# Patient Record
Sex: Female | Born: 1960 | Race: Black or African American | Hispanic: No | Marital: Single | Smoking: Never smoker
Health system: Southern US, Community
[De-identification: ages and names within clinical notes are randomized; demographics above are authoritative.]

## PROBLEM LIST (undated history)

## (undated) DIAGNOSIS — C801 Malignant (primary) neoplasm, unspecified: Secondary | ICD-10-CM

## (undated) DIAGNOSIS — R519 Headache, unspecified: Secondary | ICD-10-CM

## (undated) HISTORY — PX: BREAST EXCISIONAL BIOPSY: SUR124

---

## 1991-09-25 DIAGNOSIS — R011 Cardiac murmur, unspecified: Secondary | ICD-10-CM | POA: Insufficient documentation

## 1991-09-25 HISTORY — DX: Cardiac murmur, unspecified: R01.1

## 1999-09-25 DIAGNOSIS — N809 Endometriosis, unspecified: Secondary | ICD-10-CM

## 1999-09-25 HISTORY — PX: OTHER SURGICAL HISTORY: SHX169

## 1999-09-25 HISTORY — DX: Endometriosis, unspecified: N80.9

## 2003-09-25 HISTORY — PX: OTHER SURGICAL HISTORY: SHX169

## 2006-09-24 DIAGNOSIS — Q438 Other specified congenital malformations of intestine: Secondary | ICD-10-CM | POA: Insufficient documentation

## 2006-09-24 HISTORY — PX: ABLATION ON ENDOMETRIOSIS: SHX5787

## 2006-09-24 HISTORY — PX: COLON SURGERY: SHX602

## 2006-09-24 HISTORY — DX: Other specified congenital malformations of intestine: Q43.8

## 2020-05-24 ENCOUNTER — Ambulatory Visit: Payer: Self-pay | Admitting: Internal Medicine

## 2020-05-24 ENCOUNTER — Encounter: Payer: Self-pay | Admitting: Internal Medicine

## 2020-05-24 VITALS — BP 130/84 | HR 70 | Resp 12 | Ht 69.5 in | Wt 168.0 lb

## 2020-05-24 DIAGNOSIS — R011 Cardiac murmur, unspecified: Secondary | ICD-10-CM

## 2020-05-24 DIAGNOSIS — N809 Endometriosis, unspecified: Secondary | ICD-10-CM | POA: Insufficient documentation

## 2020-05-24 DIAGNOSIS — Z13220 Encounter for screening for lipoid disorders: Secondary | ICD-10-CM

## 2020-05-24 DIAGNOSIS — N951 Menopausal and female climacteric states: Secondary | ICD-10-CM

## 2020-05-24 DIAGNOSIS — R079 Chest pain, unspecified: Secondary | ICD-10-CM

## 2020-05-24 DIAGNOSIS — R1031 Right lower quadrant pain: Secondary | ICD-10-CM

## 2020-05-24 NOTE — Patient Instructions (Signed)
[facility] ?[address]  ?[place], [place] [address] ?[phone] ?40% Discount for [facility] Patients  ?

## 2020-05-24 NOTE — Progress Notes (Signed)
Subjective:    Patient ID: Valerie Webb Height, female   DOB: 1961-08-30, 59 y.o.   MRN: [id number]   HPI   Here to establish  1.  Menopausal symptoms:  Started age 9 yo.  Having hot flashes and night sweats, restless sleep.  Has tried different vitamins.  Not really bothering her that much.    2.  Right lower abdominal pain for about 2 months.  Feels like cramping--more like period cramping.  Comes on without obvious provocation--no link to eating.  No change with BM.  Can last anywhere from 5 minutes to 30 minutes and resolves generally by itself.  Takes Alka seltzer as that helped with cramps in the past.  No associated nausea, vomiting, diarrhea, constipation.  No vaginal discharge or associated vaginal bleeding.  No weight loss.   She still has her appendix and gallbladder intact.   3.  Left sternal chest pain:   She is concerned as she has developed left sternal sharp chest pain.  More prominent if sleeping on left side.  If she rolls over on her right side, resolves quickly.   Can happen when at rest or with exertion.  This has not changed from the beginning.   4.  History of heart murmur:  Diagnosed in her 42s.  She underwent evaluation--sounds like she had and echo.  She felt she was having pain with it and was given an unknown medication.  Was only to take if felt pain.  She cannot recall what the medication was.  No outpatient medications have been marked as taking for the 05/24/20 encounter (Office Visit) with Mack Hook, MD.   No Known Allergies   Past Medical History:  Diagnosis Date  . Congenital redundant colon 2008  . Endometriosis 2001   Found on exploratory laparoscopic surgery.  Marland Kitchen Heart murmur 1993    Past Surgical History:  Procedure Laterality Date  . ABLATION ON ENDOMETRIOSIS  2008  . COLON SURGERY  2008   Removal of redundant colon found on surgery for removal of endometriosis surgery  . Exploratory Laparoscopic surgery  2001   Pelvic and  abdominal pain    Family History  Problem Relation Age of Onset  . Alcohol abuse Father   . Cancer Father        Throat cancer:  cause of death  . Drug abuse Father   . Non-Hodgkin's lymphoma Sister 29       Remission   Social History   Socioeconomic History  . Marital status: Single    Spouse name: Not on file  . Number of children: 0  . Years of education: Not on file  . Highest education level: Bachelor's degree (e.g., BA, AB, BS)  Occupational History  . Occupation: Corporate treasurer    Comment: unemployed.  Tobacco Use  . Smoking status: Never Smoker  . Smokeless tobacco: Never Used  Vaping Use  . Vaping Use: Never used  Substance and Sexual Activity  . Alcohol use: Not Currently  . Drug use: Never  . Sexual activity: Not Currently  Other Topics Concern  . Not on file  Social History Narrative   Lives at home with her mom and youngest sister.  Other sister is living in [place].   Social Determinants of Health   Financial Resource Strain: Low Risk   . Difficulty of Paying Living Expenses: Not hard at all  Food Insecurity: No Food Insecurity  . Worried About Charity fundraiser in the Last Year:  Never true  . Ran Out of Food in the Last Year: Never true  Transportation Needs: No Transportation Needs  . Lack of Transportation (Medical): No  . Lack of Transportation (Non-Medical): No  Physical Activity:   . Days of Exercise per Week: Not on file  . Minutes of Exercise per Session: Not on file  Stress: No Stress Concern Present  . Feeling of Stress : Only a little  Social Connections: Socially Isolated  . Frequency of Communication with Friends and Family: Once a week  . Frequency of Social Gatherings with Friends and Family: Once a week  . Attends Religious Services: 1 to 4 times per year  . Active Member of Clubs or Organizations: No  . Attends Archivist Meetings: Never  . Marital Status: Never married  Intimate Partner Violence: Not At Risk  . Fear  of Current or Ex-Partner: No  . Emotionally Abused: No  . Physically Abused: No  . Sexually Abused: No      Review of Systems    Objective:   BP 130/84 (BP Location: Left Arm, Patient Position: Sitting, Cuff Size: Normal)   Pulse 70   Resp 12   Ht 5' 9.5" (1.765 m)   Wt 168 lb (76.2 kg)   LMP  (LMP Unknown)   BMI 24.45 kg/m   Physical Exam  NAD HEENT:  PERRL, EOMI, TMs pearly gray, throat without injection Neck:  Supple, No adenopathy, no thyromegaly Chest:  CTA NT with palpation over left anterior and lateral chest. CV:  RRR with occasional extrasystoles.  Grade II-VI low systolic murmur heard best at LLSB.  Perhaps a midsystolic click.  No radiation to carotids.  No carotid bruits.  Carotid, radial and DP pulses normal and equal Abd:  S, mild RLQ tenderness without rebound or peritoneal signs.  No HSM or mass, + BS LE:  No edema   ECG:  NSR, borderline LVH measurement.  No LAE   Assessment & Plan   1.  RLQ pain:  With patients previous surgeries, hard to ascertain cause.  CT of abdomen/pelvis ordered.  Discussed would need to subsequently apply for [facility] Financial assistance.   CBC, CMP  2. Heart murmur:  Not clear what has been done or results to assess in past.  Perhaps mitral in origin with findings on exam.  ECG appears normal.  Referral to cardiology.  3.  Chest pain:  Likely musculoskeletal in origin based on history.  Check FLP.    4.  Menopausal:  [facility] for black cohosh if she chooses.

## 2020-05-25 LAB — CBC WITH DIFFERENTIAL/PLATELET
Basophils Absolute: 0 10*3/uL (ref 0.0–0.2)
Basos: 0 %
EOS (ABSOLUTE): 0 10*3/uL (ref 0.0–0.4)
Eos: 0 %
Hematocrit: 40.7 % (ref 34.0–46.6)
Hemoglobin: 13.3 g/dL (ref 11.1–15.9)
Immature Grans (Abs): 0 10*3/uL (ref 0.0–0.1)
Immature Granulocytes: 0 %
Lymphocytes Absolute: 1.6 10*3/uL (ref 0.7–3.1)
Lymphs: 29 %
MCH: 26.9 pg (ref 26.6–33.0)
MCHC: 32.7 g/dL (ref 31.5–35.7)
MCV: 82 fL (ref 79–97)
Monocytes Absolute: 0.5 10*3/uL (ref 0.1–0.9)
Monocytes: 10 %
Neutrophils Absolute: 3.4 10*3/uL (ref 1.4–7.0)
Neutrophils: 61 %
Platelets: 274 10*3/uL (ref 150–450)
RBC: 4.95 x10E6/uL (ref 3.77–5.28)
RDW: 12.3 % (ref 11.7–15.4)
WBC: 5.5 10*3/uL (ref 3.4–10.8)

## 2020-05-25 LAB — COMPREHENSIVE METABOLIC PANEL
ALT: 13 IU/L (ref 0–32)
AST: 18 IU/L (ref 0–40)
Albumin/Globulin Ratio: 1.5 (ref 1.2–2.2)
Albumin: 4.4 g/dL (ref 3.8–4.9)
Alkaline Phosphatase: 118 IU/L (ref 48–121)
BUN/Creatinine Ratio: 18 (ref 9–23)
BUN: 13 mg/dL (ref 6–24)
Bilirubin Total: <0.2 mg/dL (ref 0.0–1.2)
CO2: 25 mmol/L (ref 20–29)
Calcium: 10 mg/dL (ref 8.7–10.2)
Chloride: 104 mmol/L (ref 96–106)
Creatinine, Ser: 0.71 mg/dL (ref 0.57–1.00)
GFR calc Af Amer: 109 mL/min/{1.73_m2} (ref >59)
GFR calc non Af Amer: 94 mL/min/{1.73_m2} (ref >59)
Globulin, Total: 3 g/dL (ref 1.5–4.5)
Glucose: 84 mg/dL (ref 65–99)
Potassium: 4.6 mmol/L (ref 3.5–5.2)
Sodium: 141 mmol/L (ref 134–144)
Total Protein: 7.4 g/dL (ref 6.0–8.5)

## 2020-05-25 LAB — LIPID PANEL W/O CHOL/HDL RATIO
Cholesterol, Total: 199 mg/dL (ref 100–199)
HDL: 66 mg/dL (ref >39)
LDL Chol Calc (NIH): 117 mg/dL — ABNORMAL HIGH (ref 0–99)
Triglycerides: 92 mg/dL (ref 0–149)
VLDL Cholesterol Cal: 16 mg/dL (ref 5–40)

## 2020-05-27 NOTE — Progress Notes (Signed)
Social worker met with new patient who is scheduled with Dr. Amil Amen for medical visit. Social worker completed New Patient Questionnaire which included completion of housing, intimate partner violence, transportation needs, stress, Emergency planning/management officer strain, food insecurity and screeners. Social History   Socioeconomic History  . Marital status: Single    Spouse name: Not on file  . Number of children: 0  . Years of education: Not on file  . Highest education level: Bachelor's degree (e.g., BA, AB, BS)  Occupational History  . Occupation: Corporate treasurer    Comment: unemployed.  Tobacco Use  . Smoking status: Never Smoker  . Smokeless tobacco: Never Used  Vaping Use  . Vaping Use: Never used  Substance and Sexual Activity  . Alcohol use: Not Currently  . Drug use: Never  . Sexual activity: Not Currently  Other Topics Concern  . Not on file  Social History Narrative   Lives at home with her mom and youngest sister.  Other sister is living in [place].   Social Determinants of Health   Financial Resource Strain: Low Risk   . Difficulty of Paying Living Expenses: Not hard at all  Food Insecurity: No Food Insecurity  . Worried About Charity fundraiser in the Last Year: Never true  . Ran Out of Food in the Last Year: Never true  Transportation Needs: No Transportation Needs  . Lack of Transportation (Medical): No  . Lack of Transportation (Non-Medical): No  Physical Activity:   . Days of Exercise per Week: Not on file  . Minutes of Exercise per Session: Not on file  Stress: No Stress Concern Present  . Feeling of Stress : Only a little  Social Connections: Socially Isolated  . Frequency of Communication with Friends and Family: Once a week  . Frequency of Social Gatherings with Friends and Family: Once a week  . Attends Religious Services: 1 to 4 times per year  . Active Member of Clubs or Organizations: No  . Attends Archivist Meetings: Never  . Marital Status:  Never married    Depression screen [facility] 2/9 05/27/2020  Decreased Interest 0  Down, Depressed, Hopeless 0  PHQ - 2 Score 0  Altered sleeping 1  Tired, decreased energy 1  Change in appetite 0  Feeling bad or failure about yourself  0  Trouble concentrating 0  Moving slowly or fidgety/restless 0  Suicidal thoughts 0  PHQ-9 Score 2  Difficult doing work/chores Not difficult at all    GAD 7 : Generalized Anxiety Score 05/27/2020  Nervous, Anxious, on Edge 0  Control/stop worrying 0  Worry too much - different things 0  Trouble relaxing 0  Restless 0  Easily annoyed or irritable 0  Afraid - awful might happen 0  Total GAD 7 Score 0  Anxiety Difficulty Not difficult at all     Based on presentation no recommendations at this time.   LCSW provided business card if she were to need any counseling/case management she could contact LCSW. Patient accepted card.

## 2020-06-24 ENCOUNTER — Ambulatory Visit (INDEPENDENT_AMBULATORY_CARE_PROVIDER_SITE_OTHER): Payer: Self-pay | Admitting: Internal Medicine

## 2020-06-24 ENCOUNTER — Encounter: Payer: Self-pay | Admitting: Internal Medicine

## 2020-06-24 ENCOUNTER — Other Ambulatory Visit: Payer: Self-pay

## 2020-06-24 VITALS — BP 124/88 | HR 85 | Ht 71.0 in | Wt 165.8 lb

## 2020-06-24 DIAGNOSIS — I517 Cardiomegaly: Secondary | ICD-10-CM | POA: Insufficient documentation

## 2020-06-24 DIAGNOSIS — R011 Cardiac murmur, unspecified: Secondary | ICD-10-CM

## 2020-06-24 DIAGNOSIS — E785 Hyperlipidemia, unspecified: Secondary | ICD-10-CM

## 2020-06-24 NOTE — Progress Notes (Signed)
Cardiology Office Note:    Date:  06/24/2020   ID:  Milus Height, DOB 1960/12/01, MRN [id number]  PCP:  Mack Hook, MD  [facility] Cardiologist:  Werner Lean, MD  [place] Electrophysiologist:  None   Referring MD: Mack Hook, MD   EL:[id number] pain Consulted for the evaluation of Heart murmur at the behest of Mack Hook, MD  History of Present Illness:    Valerie Webb is a 59 y.o. female with hyperlipidemia hx of heart murmur.    Patient notes that she is doing well.  Patient notes that she has a history of a heart murmur.  Normal echocardiogram in the 90s.  Patient has had chest discomforttfor a bout a year or two.  Happens more other in the evenings that during the day.  Happens spontaneously.  Last week had sharp, localized pain over the sternum.  No radiated.  Worse when lying on the left side.  Between when sitting up.  No prior medications or OTCs attempted.  No medications presently.  No escalation in symptoms.   Most active patient has been has been occasional walks about a mile.  No chest pain or chest sharpness, no DOE.  No syncope.  No near syncope or dizziness after meals, in the heat, or after exercise.  Past Medical History:  Diagnosis Date  . Congenital redundant colon 2008  . Endometriosis 2001   Found on exploratory laparoscopic surgery.  Marland Kitchen Heart murmur 1993    Past Surgical History:  Procedure Laterality Date  . ABLATION ON ENDOMETRIOSIS  2008  . COLON SURGERY  2008   Removal of redundant colon found on surgery for removal of endometriosis surgery  . Exploratory Laparoscopic surgery  2001   Pelvic and abdominal pain    Current Medications: No outpatient medications have been marked as taking for the 06/24/20 encounter (Office Visit) with Werner Lean, MD.     Allergies:   Patient has no known allergies.   Social History   Socioeconomic History  . Marital status: Single     Spouse name: Not on file  . Number of children: 0  . Years of education: Not on file  . Highest education level: Bachelor's degree (e.g., BA, AB, BS)  Occupational History  . Occupation: Corporate treasurer    Comment: unemployed.  Tobacco Use  . Smoking status: Never Smoker  . Smokeless tobacco: Never Used  Vaping Use  . Vaping Use: Never used  Substance and Sexual Activity  . Alcohol use: Not Currently  . Drug use: Never  . Sexual activity: Not Currently  Other Topics Concern  . Not on file  Social History Narrative   Lives at home with her mom and youngest sister.  Other sister is living in [place].   Social Determinants of Health   Financial Resource Strain: Low Risk   . Difficulty of Paying Living Expenses: Not hard at all  Food Insecurity: No Food Insecurity  . Worried About Charity fundraiser in the Last Year: Never true  . Ran Out of Food in the Last Year: Never true  Transportation Needs: No Transportation Needs  . Lack of Transportation (Medical): No  . Lack of Transportation (Non-Medical): No  Physical Activity:   . Days of Exercise per Week: Not on file  . Minutes of Exercise per Session: Not on file  Stress: No Stress Concern Present  . Feeling of Stress : Only a little  Social Connections: Socially Isolated  . Frequency  of Communication with Friends and Family: Once a week  . Frequency of Social Gatherings with Friends and Family: Once a week  . Attends Religious Services: 1 to 4 times per year  . Active Member of Clubs or Organizations: No  . Attends Archivist Meetings: Never  . Marital Status: Never married    Family History: The patient's family history includes Alcohol abuse in her father; Cancer in her father; Drug abuse in her father; Non-Hodgkin's lymphoma (age of onset: 49) in her sister. No hx of bicuspid valve. No history of SCD.  ROS:   Please see the history of present illness.    All other systems reviewed and are  negative.  EKGs/Labs/Other Studies Reviewed:    The following studies were reviewed today:  EKG:   The ekg ordered today demonstrates left ventricular hypertrophy, rate of 70, sinus  Recent Labs: 05/24/2020: ALT 13; BUN 13; Creatinine, Ser 0.71; Hemoglobin 13.3; Platelets 274; Potassium 4.6; Sodium 141  Recent Lipid Panel    Component Value Date/Time   CHOL 199 05/24/2020 1612   TRIG 92 05/24/2020 1612   HDL 66 05/24/2020 1612   LDLCALC 117 (H) 05/24/2020 1612   Physical Exam:    VS:  BP 124/88   Pulse 85   Ht 5\' 11"  (1.803 m)   Wt 165 lb 12.8 oz (75.2 kg)   LMP  (LMP Unknown)   SpO2 98%   BMI 23.12 kg/m     Wt Readings from Last 3 Encounters:  06/24/20 165 lb 12.8 oz (75.2 kg)  05/24/20 168 lb (76.2 kg)    GEN: Well nourished, well developed in no acute distress HEENT: Normal NECK: No JVD; No carotid bruits LYMPHATICS: No lymphadenopathy CARDIAC: Systolic crescendo more prominent with hand grip and standing; no rubs, gallops regular rate and rhythm RESPIRATORY:  Clear to auscultation without rales, wheezing or rhonchi  ABDOMEN: Soft, non-tender, non-distended MUSCULOSKELETAL:  No edema; No deformity  SKIN: Warm and dry NEUROLOGIC:  Alert and oriented x 3 PSYCHIATRIC:  Normal affect   ASSESSMENT:    1. Heart murmur   2. Hyperlipidemia, unspecified hyperlipidemia type   3. LVH (left ventricular hypertrophy)    PLAN:    In order of problems listed above:  Heart Murmur- systolic murmur with LVH without secondary etiology, and without secondary cause - will get echocardiogram for evaluation of HCM or HOCM ASCVD Risk of 3.9% with HLD - discussed diet and exercise  1 year follow up unless new symptoms or abnormal test results warranting change in plan  Would be reasonable for Virtual Follow up Would be reasonable for  APP Follow up   Medication Adjustments/Labs and Tests Ordered: Current medicines are reviewed at length with the patient today.  Concerns  regarding medicines are outlined above.  Orders Placed This Encounter  Procedures  . ECHOCARDIOGRAM COMPLETE   No orders of the defined types were placed in this encounter.   Patient Instructions  Medication Instructions:  Your physician recommends that you continue on your current medications as directed. Please refer to the Current Medication list given to you today.  *If you need a refill on your cardiac medications before your next appointment, please call your pharmacy*   Lab Work: none If you have labs (blood work) drawn today and your tests are completely normal, you will receive your results only by: Marland Kitchen MyChart Message (if you have MyChart) OR . A paper copy in the mail If you have any lab test that is  abnormal or we need to change your treatment, we will call you to review the results.   Testing/Procedures: Your physician has requested that you have an echocardiogram. Echocardiography is a painless test that uses sound waves to create images of your heart. It provides your doctor with information about the size and shape of your heart and how well your heart's chambers and valves are working. This procedure takes approximately one hour. There are no restrictions for this procedure.     Follow-Up: At [facility], you and your health needs are our priority.  As part of our continuing mission to provide you with exceptional heart care, we have created designated Provider Care Teams.  These Care Teams include your primary Cardiologist (physician) and Advanced Practice Providers (APPs -  Physician Assistants and Nurse Practitioners) who all work together to provide you with the care you need, when you need it.  We recommend signing up for the patient portal called "MyChart".  Sign up information is provided on this After Visit Summary.  MyChart is used to connect with patients for Virtual Visits (Telemedicine).  Patients are able to view lab/test results, encounter notes,  upcoming appointments, etc.  Non-urgent messages can be sent to your provider as well.   To learn more about what you can do with MyChart, go to [email or web address].    Your next appointment:   1 year(s)  The format for your next appointment:   In Person  Provider:   You may see Werner Lean, MD or one of the following Advanced Practice Providers on your designated Care Team:    Melina Copa, PA-C  Ermalinda Barrios, PA-C    Other Instructions      Signed, Werner Lean, MD  06/24/2020 11:41 AM    [place]

## 2020-06-24 NOTE — Patient Instructions (Signed)
Medication Instructions:  Your physician recommends that you continue on your current medications as directed. Please refer to the Current Medication list given to you today.  *If you need a refill on your cardiac medications before your next appointment, please call your pharmacy*   Lab Work: none If you have labs (blood work) drawn today and your tests are completely normal, you will receive your results only by: Marland Kitchen MyChart Message (if you have MyChart) OR . A paper copy in the mail If you have any lab test that is abnormal or we need to change your treatment, we will call you to review the results.   Testing/Procedures: Your physician has requested that you have an echocardiogram. Echocardiography is a painless test that uses sound waves to create images of your heart. It provides your doctor with information about the size and shape of your heart and how well your heart's chambers and valves are working. This procedure takes approximately one hour. There are no restrictions for this procedure.     Follow-Up: At [facility], you and your health needs are our priority.  As part of our continuing mission to provide you with exceptional heart care, we have created designated Provider Care Teams.  These Care Teams include your primary Cardiologist (physician) and Advanced Practice Providers (APPs -  Physician Assistants and Nurse Practitioners) who all work together to provide you with the care you need, when you need it.  We recommend signing up for the patient portal called "MyChart".  Sign up information is provided on this After Visit Summary.  MyChart is used to connect with patients for Virtual Visits (Telemedicine).  Patients are able to view lab/test results, encounter notes, upcoming appointments, etc.  Non-urgent messages can be sent to your provider as well.   To learn more about what you can do with MyChart, go to [email or web address].    Your next appointment:   1  year(s)  The format for your next appointment:   In Person  Provider:   You may see Werner Lean, MD or one of the following Advanced Practice Providers on your designated Care Team:    Melina Copa, PA-C  Ermalinda Barrios, PA-C    Other Instructions

## 2020-07-14 ENCOUNTER — Ambulatory Visit (HOSPITAL_COMMUNITY): Payer: Self-pay | Attending: Cardiology

## 2020-07-14 ENCOUNTER — Other Ambulatory Visit: Payer: Self-pay

## 2020-07-14 DIAGNOSIS — R011 Cardiac murmur, unspecified: Secondary | ICD-10-CM | POA: Insufficient documentation

## 2020-07-14 DIAGNOSIS — I517 Cardiomegaly: Secondary | ICD-10-CM | POA: Insufficient documentation

## 2020-07-14 DIAGNOSIS — E785 Hyperlipidemia, unspecified: Secondary | ICD-10-CM | POA: Insufficient documentation

## 2020-07-14 LAB — ECHOCARDIOGRAM COMPLETE
Area-P 1/2: 1.78 cm2
S' Lateral: 3.1 cm

## 2020-07-31 DIAGNOSIS — R1031 Right lower quadrant pain: Secondary | ICD-10-CM | POA: Insufficient documentation

## 2020-07-31 DIAGNOSIS — R079 Chest pain, unspecified: Secondary | ICD-10-CM | POA: Insufficient documentation

## 2020-07-31 DIAGNOSIS — N951 Menopausal and female climacteric states: Secondary | ICD-10-CM | POA: Insufficient documentation

## 2020-08-24 ENCOUNTER — Ambulatory Visit: Payer: Self-pay | Admitting: Internal Medicine

## 2020-11-17 ENCOUNTER — Ambulatory Visit: Payer: Self-pay | Admitting: Internal Medicine

## 2020-11-17 ENCOUNTER — Encounter: Payer: Self-pay | Admitting: Internal Medicine

## 2020-11-17 ENCOUNTER — Other Ambulatory Visit: Payer: Self-pay

## 2020-11-17 VITALS — BP 150/90 | HR 80 | Resp 12 | Ht 71.0 in | Wt 169.8 lb

## 2020-11-17 DIAGNOSIS — Z9189 Other specified personal risk factors, not elsewhere classified: Secondary | ICD-10-CM

## 2020-11-17 DIAGNOSIS — R1031 Right lower quadrant pain: Secondary | ICD-10-CM

## 2020-11-17 DIAGNOSIS — R030 Elevated blood-pressure reading, without diagnosis of hypertension: Secondary | ICD-10-CM

## 2020-11-17 DIAGNOSIS — R079 Chest pain, unspecified: Secondary | ICD-10-CM

## 2020-11-17 DIAGNOSIS — N951 Menopausal and female climacteric states: Secondary | ICD-10-CM

## 2020-11-17 DIAGNOSIS — Z23 Encounter for immunization: Secondary | ICD-10-CM

## 2020-11-17 NOTE — Progress Notes (Signed)
    Subjective:    Patient ID: Valerie Webb, female   DOB: 02/11/1961, 60 y.o.   MRN: [id number]   HPI   1.  Concern for decreased metabolism:  Discussed dietary and physical activity work.    2.  Heart Murmur and chest pain:  Was evaluated by cardiology with echo, which was normal.  Having mild chest discomfort here and there--not nearly as frequently and feels secondary to taking ASA 81 mg.  Taking with food daily.    3.  Elevated BP:  Has never been told she has high BP before, but did have a headache this morning--lingering still.  Took tylenol for that.    4.  RLQ pain:  Was scheduled for CT of pelvis/abdomen.  Apparently there was confusion over the CT scheduling and ended up never having that done.  She states she was called by [facility], not from [facility] or [facility].  No outpatient medications have been marked as taking for the 11/17/20 encounter (Office Visit) with Mack Hook, MD.   No Known Allergies   Review of Systems    Objective:   BP (!) 150/90 (BP Location: Right Arm, Patient Position: Sitting, Cuff Size: Normal)   Pulse 80   Resp 12   Ht 5\' 11"  (1.803 m)   Wt 169 lb 12 oz (77 kg)   LMP  (LMP Unknown)   BMI 23.68 kg/m   Physical Exam  NAD HEENT:  PERRL, EOMI Neck:  Supple, No adenopathy Chest:  CTA CV:  RRR with no murmur heard today. Abd:  Mild bilateral lower quadrant tenderness, + BS, No HSM or mass.  Assessment & Plan  1.  Concern for decreased metabolism with menopause:  Discussed dietary changes and maintaining regular physical activity.  BMI at good place.  2.  History of heart murmur with chest pain:  No concerning findings on echo.  Symptoms improved.  3.  Elevated BP:  Nurse visit for BP check with Hep B vaccination in 2 months.  4.  HM:  Hepatitis A and B vaccine today.  Hep B in 2 months and Twinrix again in 6 months.  CPE in 4-6 months.    5.  Need for dental care:  Dental referral.  6.  Chronic RLQ pain:  Not clear what  happened with previous order for CT of Abd/pelvis.  Unlikely her CT was through [facility] as usually with [facility] through [facility].  Resend.  History of endometriosis and multiple pelvic surgeries.

## 2021-01-16 ENCOUNTER — Other Ambulatory Visit: Payer: Self-pay | Admitting: Internal Medicine

## 2021-04-18 ENCOUNTER — Other Ambulatory Visit: Payer: Self-pay

## 2021-04-18 ENCOUNTER — Ambulatory Visit: Payer: Self-pay | Admitting: Internal Medicine

## 2021-04-18 ENCOUNTER — Encounter: Payer: Self-pay | Admitting: Internal Medicine

## 2021-04-18 ENCOUNTER — Other Ambulatory Visit: Payer: Self-pay | Admitting: Internal Medicine

## 2021-04-18 VITALS — BP 144/90 | HR 80 | Resp 14 | Ht 70.0 in | Wt 169.0 lb

## 2021-04-18 DIAGNOSIS — Z23 Encounter for immunization: Secondary | ICD-10-CM

## 2021-04-18 DIAGNOSIS — Z0000 Encounter for general adult medical examination without abnormal findings: Secondary | ICD-10-CM

## 2021-04-18 DIAGNOSIS — Z1231 Encounter for screening mammogram for malignant neoplasm of breast: Secondary | ICD-10-CM

## 2021-04-18 DIAGNOSIS — N761 Subacute and chronic vaginitis: Secondary | ICD-10-CM

## 2021-04-18 DIAGNOSIS — Z1159 Encounter for screening for other viral diseases: Secondary | ICD-10-CM

## 2021-04-18 DIAGNOSIS — Z13220 Encounter for screening for lipoid disorders: Secondary | ICD-10-CM

## 2021-04-18 DIAGNOSIS — Z124 Encounter for screening for malignant neoplasm of cervix: Secondary | ICD-10-CM

## 2021-04-18 DIAGNOSIS — Z114 Encounter for screening for human immunodeficiency virus [HIV]: Secondary | ICD-10-CM

## 2021-04-18 NOTE — Patient Instructions (Signed)
Recommend obtaining shingles vaccine (shingrix) at [facility] or pharmacy

## 2021-04-18 NOTE — Progress Notes (Unsigned)
Subjective:    Patient ID: Valerie Webb, female   DOB: September 10, 1961, 60 y.o.   MRN: [id number]   HPI  CPE with pap  1.  Pap:  No pap smear in years.  Never abnormal.    2.  Mammogram:  None for years.  Once had findings leading to biopsy that was benign.  No family history of breast cancer.    3.  Osteoprevention:  Drinks 2 cups of whole milk daily.  Willing to consider 2% milk and to drink 3-4 times daily.  Not very physically active as has headaches frequently and just no energy to be active.    4.  Guaiac Cards:  Never.    5.  Colonoscopy:  Never.  No family history of colon cancer.    6.  Immunizations:  Has not had 2nd [employer] booster.  No Tdap in past 10 years.  Missed appt for Hep B #2 earlier in year. Immunization History  Administered Date(s) Administered   Hep A / Hep B 11/17/2020   PFIZER(Purple Top)SARS-COV-2 Vaccination 01/02/2020, 01/26/2020, 08/16/2020     7.  Glucose/Cholesterol:  Blood glucose and cholesterol panel fine in 04/2020. Lipid Panel     Component Value Date/Time   CHOL 199 05/24/2020 1612   TRIG 92 05/24/2020 1612   HDL 66 05/24/2020 1612   LDLCALC 117 (H) 05/24/2020 1612   LABVLDL 16 05/24/2020 1612     No outpatient medications have been marked as taking for the 04/18/21 encounter (Office Visit) with Mack Hook, MD.   No Known Allergies  Past Medical History:  Diagnosis Date   Congenital redundant colon 2008   Endometriosis 2001   Found on exploratory laparoscopic surgery.   Heart murmur 1993   Normal Echocardiogram 2021   Past Surgical History:  Procedure Laterality Date   ABLATION ON ENDOMETRIOSIS  2008   BREAST EXCISIONAL BIOPSY Right    years ago benign   COLON SURGERY  2008   Removal of redundant colon found on surgery for removal of endometriosis surgery   Exploratory Laparoscopic surgery  2001   Pelvic and abdominal pain   Family History  Problem Relation Age of Onset   Alcohol abuse Father     Cancer Father        Throat cancer:  cause of death   Drug abuse Father    Non-Hodgkin's lymphoma Sister 83       Remission   Breast cancer Neg Hx    Social History   Socioeconomic History   Marital status: Single    Spouse name: Not on file   Number of children: 0   Years of education: Not on file   Highest education level: Bachelor's degree (e.g., BA, AB, BS)  Occupational History   Occupation: Corporate treasurer    Comment: unemployed.  Tobacco Use   Smoking status: Never   Smokeless tobacco: Never  Vaping Use   Vaping Use: Never used  Substance and Sexual Activity   Alcohol use: Not Currently   Drug use: Never   Sexual activity: Not Currently  Other Topics Concern   Not on file  Social History Narrative   Lives at home with her mom and youngest sister.  Other sister is living in [place].   Social Determinants of Health   Financial Resource Strain: Low Risk  (04/18/2021)   Overall Financial Resource Strain (CARDIA)    Difficulty of Paying Living Expenses: Not hard at all  Food Insecurity: No Food Insecurity (06/13/2021)  Hunger Vital Sign    Worried About Running Out of Food in the Last Year: Never true    Ran Out of Food in the Last Year: Never true  Transportation Needs: No Transportation Needs (06/13/2021)   PRAPARE - Hydrologist (Medical): No    Lack of Transportation (Non-Medical): No  Physical Activity: Not on file  Stress: No Stress Concern Present (05/24/2020)   [place]    Feeling of Stress : Only a little  Social Connections: Socially Isolated (05/27/2020)   Social Connection and Isolation Panel [NHANES]    Frequency of Communication with Friends and Family: Once a week    Frequency of Social Gatherings with Friends and Family: Once a week    Attends Religious Services: 1 to 4 times per year    Active Member of [employer] or Organizations: No    Attends Theatre manager Meetings: Never    Marital Status: Never married  Intimate Partner Violence: Not At Risk (05/24/2020)   Humiliation, Afraid, Rape, and Kick questionnaire    Fear of Current or Ex-Partner: No    Emotionally Abused: No    Physically Abused: No    Sexually Abused: No      Review of Systems  Constitutional:  Positive for fatigue (At times.). Negative for appetite change.  HENT:  Negative for hearing loss.   Eyes:  Negative for visual disturbance (Wears glasses for myopia).  Respiratory:  Negative for cough and shortness of breath.   Cardiovascular:  Negative for chest pain, palpitations and leg swelling.  Gastrointestinal:  Positive for abdominal pain (Chronic abdominal/pelvic pain.  Has had for years.  CT to evaluate RLQ pain sent in 04/2020 to [facility] and to apply for financial assistance.  She was called for CT, but never ultimately set up.  Reordered in 10/2020 through [facility] and still awaiting appt.) and constipation (Occasional.). Negative for blood in stool and diarrhea.  Genitourinary:  Negative for vaginal bleeding and vaginal discharge.  Musculoskeletal:  Positive for arthralgias (Right knee.  Does not affect daily life.).  Skin:  Negative for rash.  Neurological:  Positive for headaches.       Headaches starts back of head and neck.  Bilateral dull ache at nape of neck and periorbital and temples.  Some pressure discomfort with this.  Tylenol helps, but the pain will return.  She has these daily--awakens with them. She awakens with them.   She does grind and clench teeth. She is not aware of stressors making this worse.  Previously with migraines monthly with periods lasting 2 days.  Now in menopause, has one every 3 months.  Takes Tylenol. Has had since age 79 yo with periods.  No aura.  Pulsing pain.  Generally unilateral and more so on right.  Starts at low occiput and goes to forehead associated with photophobia, phonophobia, N & V.   Has never taken a prescription  medication      Objective:   BP (!) 144/90 (BP Location: Left Arm, Patient Position: Sitting, Cuff Size: Normal)   Pulse 80   Resp 14   Ht '5\' 10"'$  (1.778 m)   Wt 169 lb (76.7 kg)   LMP  (LMP Unknown)   BMI 24.25 kg/m   Physical Exam HENT:     Head: Normocephalic and atraumatic.     Right Ear: Tympanic membrane, ear canal and external ear normal.     Left Ear: Tympanic  membrane, ear canal and external ear normal.     Nose: Nose normal.     Mouth/Throat:     Mouth: Mucous membranes are moist.     Pharynx: Oropharynx is clear.  Eyes:     Extraocular Movements: Extraocular movements intact.     Conjunctiva/sclera: Conjunctivae normal.     Pupils: Pupils are equal, round, and reactive to light.     Comments: Discs sharp  Neck:     Thyroid: No thyroid mass or thyromegaly.  Cardiovascular:     Rate and Rhythm: Normal rate.     Heart sounds: S1 normal and S2 normal. Murmur heard.     Systolic (LLSB) murmur is present with a grade of 2/6.     No friction rub. No S3 or S4 sounds.     Comments: No carotid bruits.  Carotid, radial, femoral, DP and PT pulses normal and equal.    Pulmonary:     Effort: Pulmonary effort is normal.     Breath sounds: Normal breath sounds.  Chest:  Breasts:    Right: No inverted nipple, mass or nipple discharge.     Left: No inverted nipple, mass or nipple discharge.  Abdominal:     General: Bowel sounds are normal.     Palpations: Abdomen is soft. There is no hepatomegaly, splenomegaly or mass.     Tenderness: There is abdominal tenderness (Mild in RLQ.  No rebound or peritoneal signs).     Hernia: No hernia is present.  Genitourinary:    Comments: Normal external female genitalia No uterine or adnexal mass or tenderness. No cervical lesions. Musculoskeletal:        General: Normal range of motion.     Cervical back: Normal range of motion and neck supple.     Right lower leg: No edema.     Left lower leg: No edema.  Lymphadenopathy:      Head:     Right side of head: No submental or submandibular adenopathy.     Left side of head: No submental or submandibular adenopathy.     Cervical: No cervical adenopathy.     Upper Body:     Right upper body: No supraclavicular or axillary adenopathy.     Left upper body: No supraclavicular or axillary adenopathy.     Lower Body: No right inguinal adenopathy. No left inguinal adenopathy.  Skin:    General: Skin is warm.     Capillary Refill: Capillary refill takes less than 2 seconds.     Findings: No rash.  Neurological:     General: No focal deficit present.     Mental Status: She is alert and oriented to person, place, and time.     Cranial Nerves: Cranial nerves 2-12 are intact.     Sensory: Sensation is intact.     Motor: Motor function is intact.     Coordination: Coordination is intact.     Gait: Gait is intact.     Deep Tendon Reflexes: Reflexes are normal and symmetric.  Psychiatric:        Attention and Perception: Attention normal.        Mood and Affect: Mood normal.        Speech: Speech normal.        Behavior: Behavior normal. Behavior is cooperative.      Assessment & Plan    CPE with pap Schedule mammogram Hepatitis B #2/3 [place] booster Tdap FIT to return in 2 weeks. CBC, CMP, HIV  and Hep C screening, FLP   2.  Elevated BP:  having headache today.  Will recheck in 2 months when in for twinrix to see if remains elevated.  3.  RLQ pain/chronic abdominal pain:  multiple surgeries for endometriosis and redundant colon in early 2000s.  Checking on CT of abdomen scheduling with [facility].  4.  Heart Murmur:  no abnormal findings end of 2021 on echo with Cardiology.

## 2021-04-19 LAB — CBC WITH DIFFERENTIAL/PLATELET
Basophils Absolute: 0 10*3/uL (ref 0.0–0.2)
Basos: 0 %
EOS (ABSOLUTE): 0 10*3/uL (ref 0.0–0.4)
Eos: 1 %
Hematocrit: 39 % (ref 34.0–46.6)
Hemoglobin: 12.9 g/dL (ref 11.1–15.9)
Immature Grans (Abs): 0 10*3/uL (ref 0.0–0.1)
Immature Granulocytes: 0 %
Lymphocytes Absolute: 1.6 10*3/uL (ref 0.7–3.1)
Lymphs: 34 %
MCH: 26.7 pg (ref 26.6–33.0)
MCHC: 33.1 g/dL (ref 31.5–35.7)
MCV: 81 fL (ref 79–97)
Monocytes Absolute: 0.4 10*3/uL (ref 0.1–0.9)
Monocytes: 8 %
Neutrophils Absolute: 2.6 10*3/uL (ref 1.4–7.0)
Neutrophils: 57 %
Platelets: 242 10*3/uL (ref 150–450)
RBC: 4.83 x10E6/uL (ref 3.77–5.28)
RDW: 12.2 % (ref 11.7–15.4)
WBC: 4.7 10*3/uL (ref 3.4–10.8)

## 2021-04-19 LAB — LIPID PANEL W/O CHOL/HDL RATIO
Cholesterol, Total: 187 mg/dL (ref 100–199)
HDL: 60 mg/dL (ref >39)
LDL Chol Calc (NIH): 109 mg/dL — ABNORMAL HIGH (ref 0–99)
Triglycerides: 101 mg/dL (ref 0–149)
VLDL Cholesterol Cal: 18 mg/dL (ref 5–40)

## 2021-04-19 LAB — COMPREHENSIVE METABOLIC PANEL
ALT: 13 IU/L (ref 0–32)
AST: 20 IU/L (ref 0–40)
Albumin/Globulin Ratio: 1.7 (ref 1.2–2.2)
Albumin: 4.7 g/dL (ref 3.8–4.9)
Alkaline Phosphatase: 109 IU/L (ref 44–121)
BUN/Creatinine Ratio: 15 (ref 9–23)
BUN: 11 mg/dL (ref 6–24)
Bilirubin Total: <0.2 mg/dL (ref 0.0–1.2)
CO2: 24 mmol/L (ref 20–29)
Calcium: 9.9 mg/dL (ref 8.7–10.2)
Chloride: 104 mmol/L (ref 96–106)
Creatinine, Ser: 0.75 mg/dL (ref 0.57–1.00)
Globulin, Total: 2.7 g/dL (ref 1.5–4.5)
Glucose: 98 mg/dL (ref 65–99)
Potassium: 4 mmol/L (ref 3.5–5.2)
Sodium: 145 mmol/L — ABNORMAL HIGH (ref 134–144)
Total Protein: 7.4 g/dL (ref 6.0–8.5)
eGFR: 92 mL/min/{1.73_m2} (ref >59)

## 2021-04-19 LAB — HIV ANTIBODY (ROUTINE TESTING W REFLEX): HIV Screen 4th Generation wRfx: NONREACTIVE

## 2021-04-19 LAB — HEPATITIS C ANTIBODY: Hep C Virus Ab: <0.1 s/co ratio (ref 0.0–0.9)

## 2021-04-20 LAB — CYTOLOGY - PAP

## 2021-04-22 LAB — GC/CHLAMYDIA PROBE AMP
Chlamydia trachomatis, NAA: NEGATIVE
Neisseria Gonorrhoeae by PCR: NEGATIVE

## 2021-05-22 ENCOUNTER — Other Ambulatory Visit (INDEPENDENT_AMBULATORY_CARE_PROVIDER_SITE_OTHER): Payer: Self-pay

## 2021-05-22 ENCOUNTER — Other Ambulatory Visit: Payer: Self-pay | Admitting: Obstetrics and Gynecology

## 2021-05-22 ENCOUNTER — Other Ambulatory Visit: Payer: Self-pay

## 2021-05-22 ENCOUNTER — Ambulatory Visit: Payer: Self-pay | Admitting: Internal Medicine

## 2021-05-22 DIAGNOSIS — Z1231 Encounter for screening mammogram for malignant neoplasm of breast: Secondary | ICD-10-CM

## 2021-05-22 DIAGNOSIS — Z1211 Encounter for screening for malignant neoplasm of colon: Secondary | ICD-10-CM

## 2021-05-22 LAB — POC FIT TEST STOOL: Fecal Occult Blood: POSITIVE

## 2021-05-25 ENCOUNTER — Other Ambulatory Visit: Payer: Self-pay | Admitting: Obstetrics and Gynecology

## 2021-05-25 DIAGNOSIS — Z1231 Encounter for screening mammogram for malignant neoplasm of breast: Secondary | ICD-10-CM

## 2021-06-13 ENCOUNTER — Encounter (INDEPENDENT_AMBULATORY_CARE_PROVIDER_SITE_OTHER): Payer: Self-pay

## 2021-06-13 ENCOUNTER — Ambulatory Visit: Payer: Self-pay | Admitting: *Deleted

## 2021-06-13 ENCOUNTER — Ambulatory Visit
Admission: RE | Admit: 2021-06-13 | Discharge: 2021-06-13 | Disposition: A | Discharge status: Home / Self Care | Payer: No Typology Code available for payment source | Source: Ambulatory Visit | Attending: Obstetrics and Gynecology | Admitting: Obstetrics and Gynecology

## 2021-06-13 ENCOUNTER — Other Ambulatory Visit: Payer: Self-pay

## 2021-06-13 VITALS — BP 142/98 | Wt 171.0 lb

## 2021-06-13 DIAGNOSIS — Z1231 Encounter for screening mammogram for malignant neoplasm of breast: Secondary | ICD-10-CM

## 2021-06-13 DIAGNOSIS — Z1239 Encounter for other screening for malignant neoplasm of breast: Secondary | ICD-10-CM

## 2021-06-13 NOTE — Progress Notes (Signed)
Ms. Valerie Webb is a 60 y.o. female who presents to [facility] clinic today with no complaints.    Pap Smear: Pap smear not completed today. Last Pap smear was 04/18/2021 at [facility] clinic and was normal. Per patient has no history of an abnormal Pap smear. Last Pap smear result is available in Epic.   Physical exam: Breasts Breasts symmetrical. No skin abnormalities bilateral breasts. No nipple retraction bilateral breasts. No nipple discharge bilateral breasts. No lymphadenopathy. No lumps palpated bilateral breasts. No complaints of pain or tenderness on exam.      Pelvic/Bimanual Pap is not indicated today per BCCCP guidelines.   Smoking History: Patient has never smoked.   Patient Navigation: Patient education provided. Access to services provided for patient through [place] program.   Colorectal Cancer Screening: Per patient has never had colonoscopy completed. Patient completed a FIT Test 05/02/2021 that was positive. Patient has an appointment with her PCP 06/21/2021 and will follow-up. No complaints today.    Breast and Cervical Cancer Risk Assessment: Patient does not have family history of breast cancer, known genetic mutations, or radiation treatment to the chest before age 24. Patient does not have history of cervical dysplasia, immunocompromised, or DES exposure in-utero.  Risk Assessment     Risk Scores       06/13/2021   Last edited by: Royston Bake, CMA   5-year risk: 1.4 %   Lifetime risk: 7.1 %            A: BCCCP exam without pap smear No complaints.  P: Referred patient to the [facility] for a screening mammogram on mobile unit. Appointment scheduled Tuesday, June 13, 2021 at 1140.  Loletta Parish, RN 06/13/2021 10:48 AM

## 2021-06-13 NOTE — Patient Instructions (Signed)
Explained breast self awareness with Milus Height. Patient did not need a Pap smear today due to last Pap smear was 04/18/2021. Let her know BCCCP will cover Pap smears every 3 years unless has a history of abnormal Pap smears. Referred patient to the [facility] for a screening mammogram on mobile unit. Appointment scheduled Tuesday, June 13, 2021 at 1140. Patient escorted to the mobile unit following BCCCP appointment for her screening mammogram. Let patient know the [facility] will follow up with her within the next couple weeks with results of her mammogram by letter or phone. Dierdre Forth Bekele verbalized understanding.  Azeem Poorman, Arvil Chaco, RN 10:48 AM

## 2021-06-21 ENCOUNTER — Other Ambulatory Visit: Payer: Self-pay

## 2021-06-21 ENCOUNTER — Ambulatory Visit: Payer: Self-pay | Admitting: Internal Medicine

## 2021-06-21 VITALS — BP 149/91

## 2021-06-21 DIAGNOSIS — Z23 Encounter for immunization: Secondary | ICD-10-CM

## 2021-06-21 DIAGNOSIS — R030 Elevated blood-pressure reading, without diagnosis of hypertension: Secondary | ICD-10-CM

## 2021-07-10 ENCOUNTER — Other Ambulatory Visit: Payer: Self-pay | Admitting: Internal Medicine

## 2021-07-10 DIAGNOSIS — R195 Other fecal abnormalities: Secondary | ICD-10-CM

## 2021-11-10 IMAGING — MG MM DIGITAL SCREENING BILAT W/ TOMO AND CAD
6 of 10 series · 6 of 30 positions shown · non-contrast
Comparison: None.

CLINICAL DATA: Screening.

EXAM:
DIGITAL SCREENING BILATERAL MAMMOGRAM WITH TOMOSYNTHESIS AND CAD
TECHNIQUE: Bilateral screening digital craniocaudal and mediolateral oblique
mammograms were obtained. Bilateral screening digital breast
tomosynthesis was performed. The images were evaluated with
computer-aided detection.

[R MLO synth-2D]
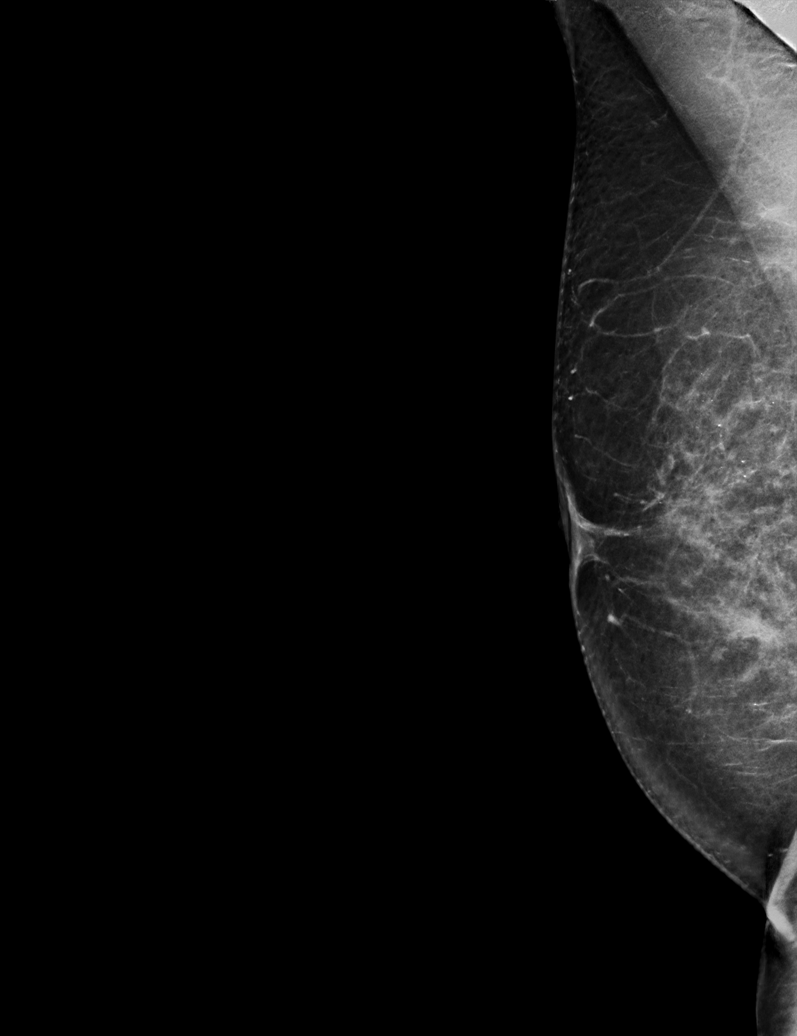

[L MLO synth-2D]
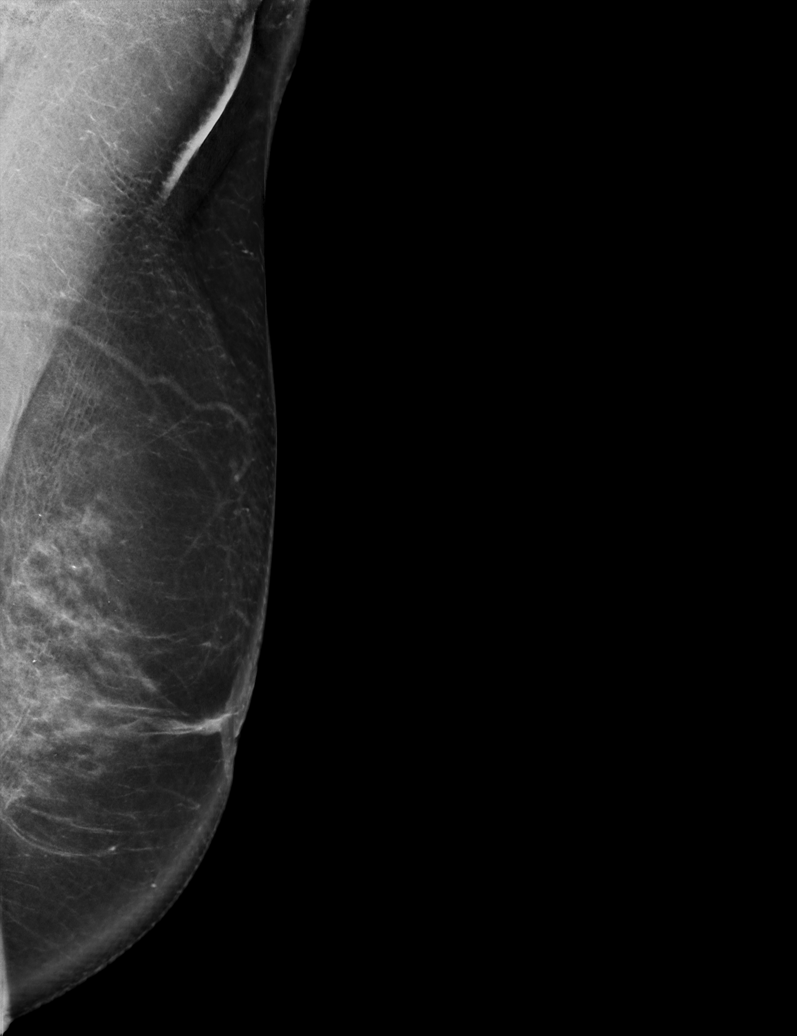

[L CC synth-2D]
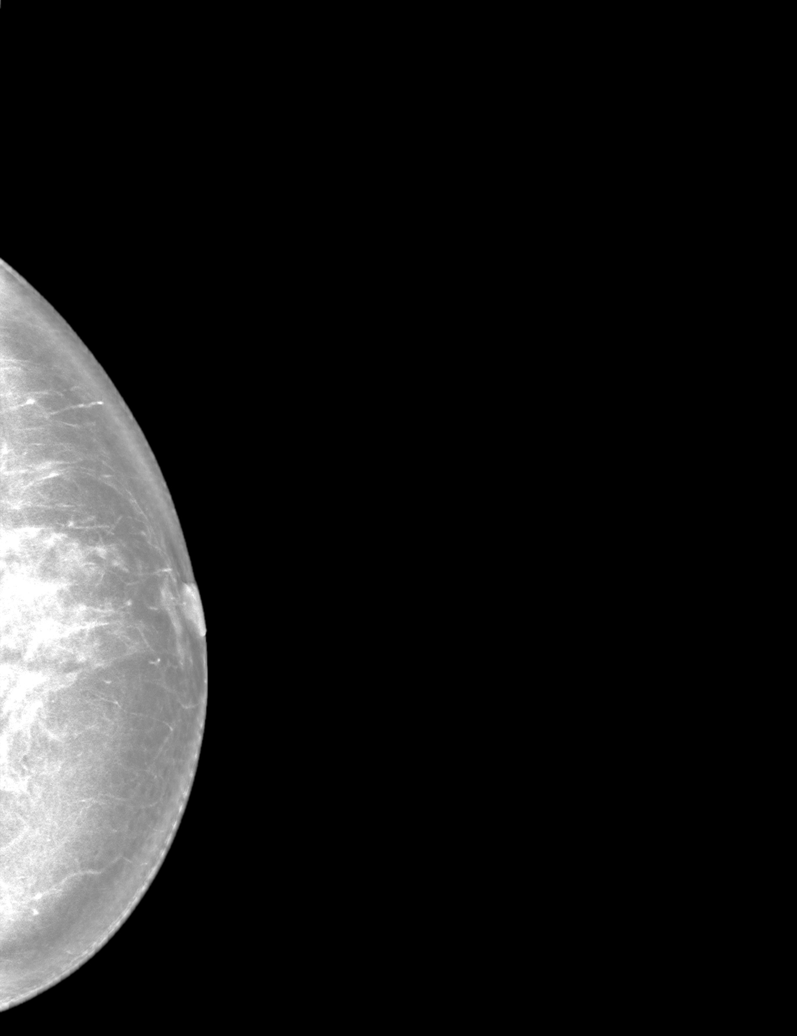

[R CC synth-2D (1 of 2)]
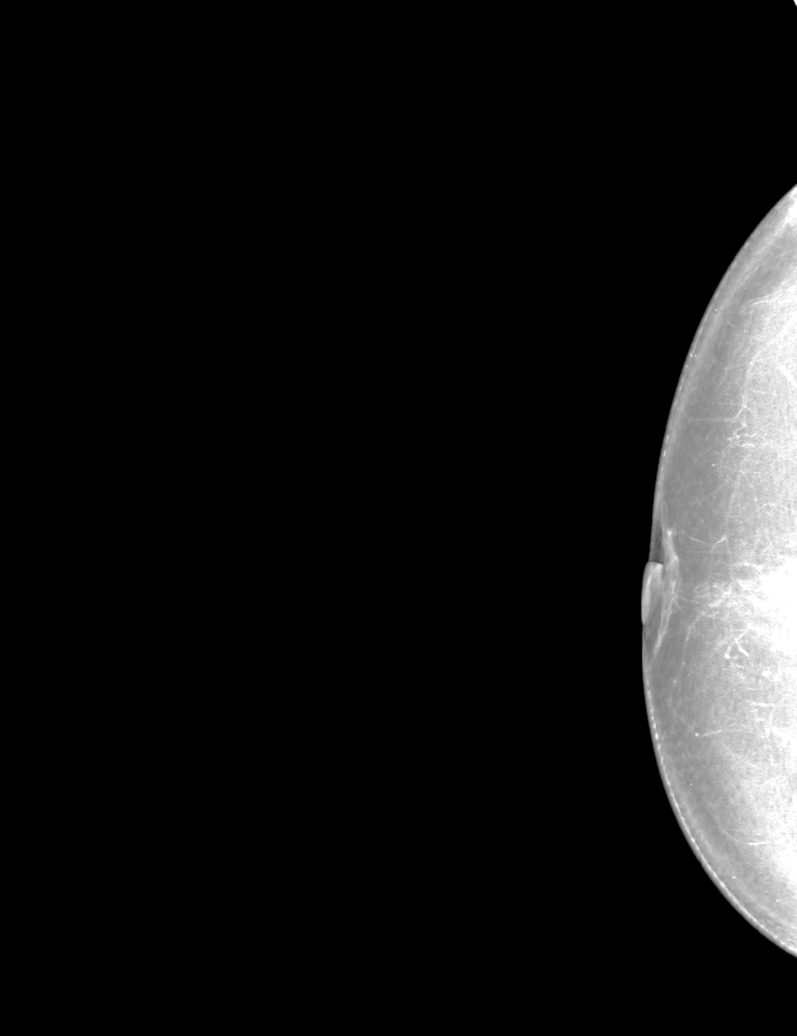

[R CC synth-2D (2 of 2)]
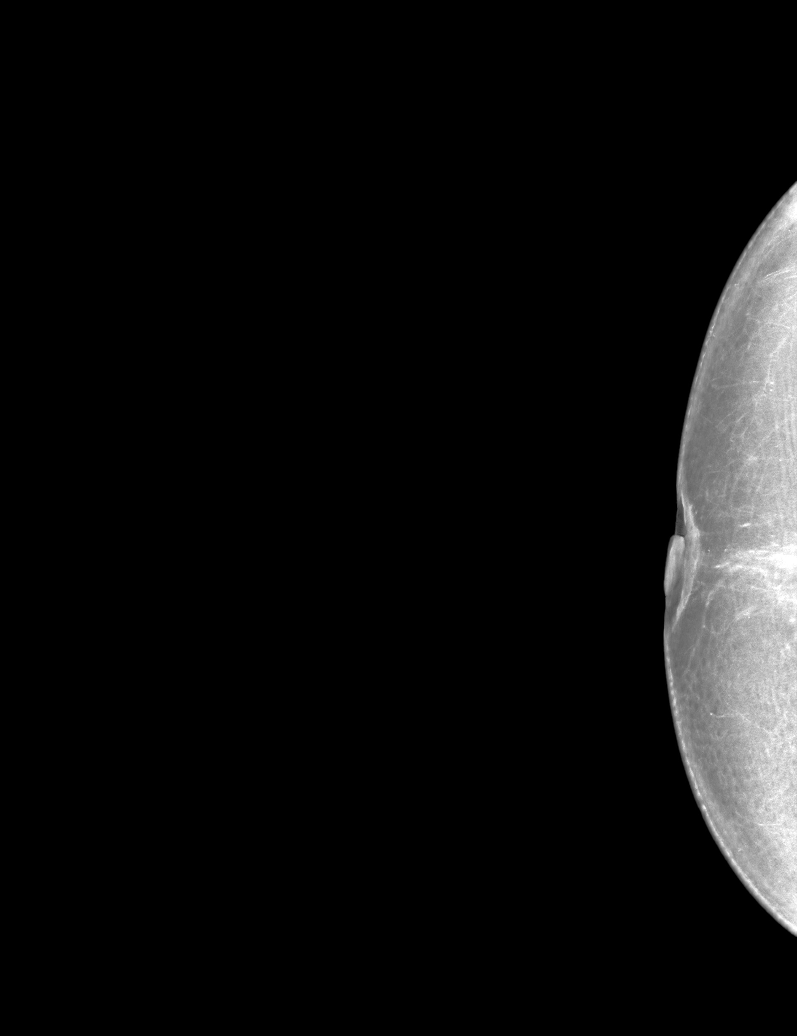

[R CC tomo · tomo slice 35/68.0]
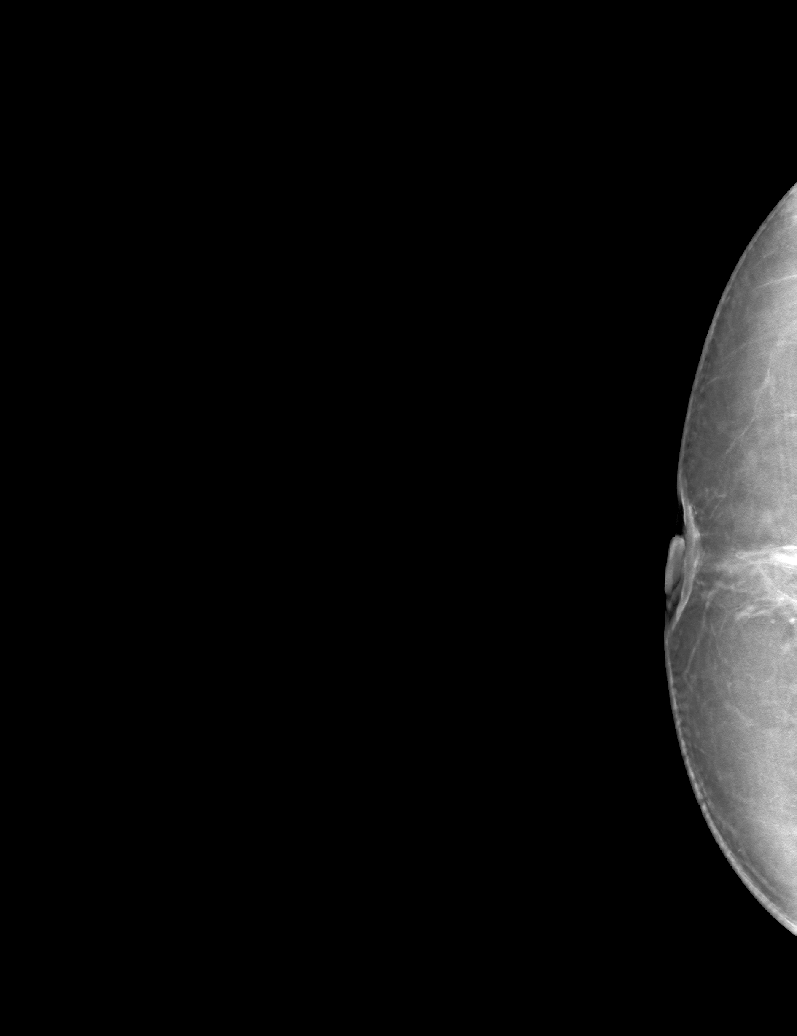

[6 of 30 positions shown; findings below may reference images not displayed]

ACR Breast Density Category c: The breast tissue is heterogeneously
dense, which may obscure small masses
FINDINGS: There are no findings suspicious for malignancy.
IMPRESSION: No mammographic evidence of malignancy. A result letter of this
screening mammogram will be mailed directly to the patient.

RECOMMENDATION:
Screening mammogram in one year. (Code:C8-T-HNK)

BI-RADS CATEGORY  1: Negative.

## 2022-03-07 ENCOUNTER — Telehealth: Payer: Self-pay

## 2022-03-07 DIAGNOSIS — K769 Liver disease, unspecified: Secondary | ICD-10-CM

## 2022-03-07 DIAGNOSIS — R911 Solitary pulmonary nodule: Secondary | ICD-10-CM

## 2022-03-07 DIAGNOSIS — K319 Disease of stomach and duodenum, unspecified: Secondary | ICD-10-CM

## 2022-03-07 NOTE — Telephone Encounter (Signed)
Patient called and would like to get referral for further imagine after visit to [facility]. Provider recommended additional imaging to evaluate hepatic and gastric lesions such as CT or MRI with IV contrast. Recommend follow-up of the tiny left lower lobe nodules.

## 2022-03-12 NOTE — Telephone Encounter (Incomplete)
Called patient:  apparently this was a CT I ordered through [facility] 10/2020.  Does not sound like she went elsewhere for new problem and ordered. She continues to have RLQ pain, unchanged for years.  No upper abdominal discomfort. When the RLQ pain is more prominent, may have associated nausea and occasionally, vomiting. No weight loss. Good appetite.   We did send her last year for diagnostic colonoscopy through [facility], but

## 2022-03-12 NOTE — Telephone Encounter (Incomplete)
(  Continuing with note, previously interrupted regarding colonoscopy) ultimately, never scheduled Will refer to [place] and have her apply for financial assistance. Will also schedule her for MR with contrast of chest and abdomen with findings on CT scan. Discussed also that her lower abdominal discomfort is likely from constipation. Would like her to start Metamucil daily in 8 oz water and Miralax 17g daily in 8 oz water   Titrate to regular soft stools.   To call if any problems.

## 2022-03-15 NOTE — Telephone Encounter (Signed)
Confirmed with that GI received patients referral. I called patient and ask her to call them to set up her initial appointment. Patient plans on calling today to get herself scheduled.

## 2022-03-16 ENCOUNTER — Encounter: Payer: Self-pay | Admitting: Gastroenterology

## 2022-04-25 ENCOUNTER — Encounter: Payer: Self-pay | Admitting: Gastroenterology

## 2022-04-25 ENCOUNTER — Ambulatory Visit (INDEPENDENT_AMBULATORY_CARE_PROVIDER_SITE_OTHER): Payer: Self-pay | Admitting: Gastroenterology

## 2022-04-25 ENCOUNTER — Other Ambulatory Visit (INDEPENDENT_AMBULATORY_CARE_PROVIDER_SITE_OTHER): Payer: Self-pay

## 2022-04-25 VITALS — BP 128/88 | HR 76 | Ht 71.0 in | Wt 161.0 lb

## 2022-04-25 DIAGNOSIS — R195 Other fecal abnormalities: Secondary | ICD-10-CM

## 2022-04-25 DIAGNOSIS — R932 Abnormal findings on diagnostic imaging of liver and biliary tract: Secondary | ICD-10-CM

## 2022-04-25 DIAGNOSIS — K3189 Other diseases of stomach and duodenum: Secondary | ICD-10-CM

## 2022-04-25 DIAGNOSIS — R112 Nausea with vomiting, unspecified: Secondary | ICD-10-CM

## 2022-04-25 LAB — COMPREHENSIVE METABOLIC PANEL
ALT: 13 U/L (ref 0–35)
AST: 17 U/L (ref 0–37)
Albumin: 4.7 g/dL (ref 3.5–5.2)
Alkaline Phosphatase: 105 U/L (ref 39–117)
BUN: 11 mg/dL (ref 6–23)
CO2: 28 mEq/L (ref 19–32)
Calcium: 10.5 mg/dL (ref 8.4–10.5)
Chloride: 101 mEq/L (ref 96–112)
Creatinine, Ser: 0.85 mg/dL (ref 0.40–1.20)
GFR: 74.38 mL/min (ref >60.00)
Glucose, Bld: 87 mg/dL (ref 70–99)
Potassium: 4.3 mEq/L (ref 3.5–5.1)
Sodium: 139 mEq/L (ref 135–145)
Total Bilirubin: 0.5 mg/dL (ref 0.2–1.2)
Total Protein: 8.8 g/dL — ABNORMAL HIGH (ref 6.0–8.3)

## 2022-04-25 LAB — CBC WITH DIFFERENTIAL/PLATELET
Basophils Absolute: 0 10*3/uL (ref 0.0–0.1)
Basophils Relative: 0.2 % (ref 0.0–3.0)
Eosinophils Absolute: 0 10*3/uL (ref 0.0–0.7)
Eosinophils Relative: 0.3 % (ref 0.0–5.0)
HCT: 42.6 % (ref 36.0–46.0)
Hemoglobin: 13.8 g/dL (ref 12.0–15.0)
Lymphocytes Relative: 21.5 % (ref 12.0–46.0)
Lymphs Abs: 1.2 10*3/uL (ref 0.7–4.0)
MCHC: 32.4 g/dL (ref 30.0–36.0)
MCV: 82.6 fl (ref 78.0–100.0)
Monocytes Absolute: 0.4 10*3/uL (ref 0.1–1.0)
Monocytes Relative: 7.9 % (ref 3.0–12.0)
Neutro Abs: 3.9 10*3/uL (ref 1.4–7.7)
Neutrophils Relative %: 70.1 % (ref 43.0–77.0)
Platelets: 263 10*3/uL (ref 150.0–400.0)
RBC: 5.16 Mil/uL — ABNORMAL HIGH (ref 3.87–5.11)
RDW: 13.7 % (ref 11.5–15.5)
WBC: 5.5 10*3/uL (ref 4.0–10.5)

## 2022-04-25 MED ORDER — NA SULFATE-K SULFATE-MG SULF 17.5-3.13-1.6 GM/177ML PO SOLN: ORAL | 0 refills | Status: DC

## 2022-04-25 NOTE — Patient Instructions (Addendum)
It was a pleasure to meet you today.  Please contact us to let us know what physician/practice you saw for GI evaluation in [place].  I have recommended labs and an expedited endoscopic evaluation.    Your provider has requested that you go to the basement level for lab work before leaving today. Press "B" on the elevator. The lab is located at the first door on the left as you exit the elevator.  You have been scheduled for an endoscopy/colonoscopy. Please follow written instructions given to you at your visit today.  If you use inhalers (even only as needed), please bring them with you on the day of your procedure.  _______________________________________________________  If you are age 12 or older, your body mass index should be between 23-30. Your Body mass index is 22.45 kg/m. If this is out of the aforementioned range listed, please consider follow up with your Primary Care Provider.  If you are age 101 or younger, your body mass index should be between 19-25. Your Body mass index is 22.45 kg/m. If this is out of the aformentioned range listed, please consider follow up with your Primary Care Provider.   ________________________________________________________  The [facility] providers would like to encourage you to use [facility] to communicate with providers for non-urgent requests or questions.  Due to long hold times on the telephone, sending your provider a message by [facility] may be a faster and more efficient way to get a response.  Please allow 48 business hours for a response.  Please remember that this is for non-urgent requests.  _______________________________________________________  Due to recent changes in healthcare laws, you may see the results of your imaging and laboratory studies on MyChart before your provider has had a chance to review them.  We understand that in some cases there may be results that are confusing or concerning to you. Not all laboratory results  come back in the same time frame and the provider may be waiting for multiple results in order to interpret others.  Please give [place] 48 hours in order for your provider to thoroughly review all the results before contacting the office for clarification of your results.

## 2022-04-25 NOTE — Progress Notes (Addendum)
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 Presenting with recent nausea and vomiting.   Multiple liver lesions - mostly consistent with cysts and hemangioma on CT and MRI.   Left lower lung nodules of unclear clinical significance  FIT+ 2022 without history of prior colonoscopy.   Prior colon "reconstruction" after treatment for endometriosis in 2008 in [place]  History of endometriosis and redundant colon with surgery over 20 years ago  PLAN: - CMP, CBC, CEA, AFP - Diagnostic EGD and colonoscopy - Obtain records from [place] regarding prior surgery   HPI: Valerie Webb is a 61 y.o. female referred by Dr. Amil Amen for further evaluation of liver disease.  The history is obtained through the patient and review of her electronic health record.  Longstanding history of left-sided abdominal pain evaluated and treated many years ago while in [place]. Treated with Lupron for endometriosis. Saw an endometriosis specialist in [place]. She had colonic reconstruction in 2008 for a diagnosis of redundant colon found at the time of surgery for endometriosis and subsequent pelvic floor PT.   Seen by Dr. Amil Amen in 2022 for the pain. CT scan ordered but not performed until earlier this year. Pain is predominantly in the RUQ with some associated nausea and vomiting. Partially relieved by alka seltzer. Weight is stable. Appetite is good. No fevers, chills, or night sweats.   CT performed 11/23/21 through [facility] showed:  1.  Multiple hepatic lesions, most of which are likely cysts, however the largest in the right lobe is nonspecific and of higher density than fluid. Differential diagnosis includes hemangioma and other solid lesions.  2.  Exophytic mass extending  posteriorly from the gastric fundus. Benign and malignant neoplasm are in the differential.  3.  Several tiny left lower lobe lung nodules measuring up to 4 mm.  4.  Fecal retention.   MRI performed 04/12/22 revealed.  1.  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  2.  Benign right hepatic hemangioma. Multiple benign-appearing hepatic cysts. No lesion suspicious for malignancy.  3.  Left renal cortical scarring. Mild left pelviectasis, new since prior exam. The ureter is suboptimally evaluated by MRI, correlate with CT or retrograde pyelogram.    No prior colonoscopy or colon cancer screening except for + FIT test 8/22. No overt GI bleeding.   CBC and CMP 04/18/21 were normal except for a serum sodium of 145  Father with throat cancer. There is no known family history of colon cancer or polyps. No other family history of stomach cancer or other GI malignancy. No family history of inflammatory bowel disease or celiac.    Past Medical History:  Diagnosis Date   Congenital redundant colon 2008   Endometriosis 2001   Found on exploratory laparoscopic surgery.   Heart murmur 1993   Normal Echocardiogram 2021    Past Surgical History:  Procedure Laterality Date   ABLATION ON ENDOMETRIOSIS  2008   BREAST EXCISIONAL BIOPSY Right    years ago benign   COLON SURGERY  2008   Removal of redundant colon found on surgery for removal of endometriosis surgery   Exploratory Laparoscopic surgery  2001   Pelvic and abdominal pain      Current Outpatient Medications  Medication Sig Dispense Refill   Multiple Vitamins-Minerals (MULTIVITAMIN ADULTS PO) Take by mouth.  Na Sulfate-K Sulfate-Mg Sulf 17.5-3.13-1.6 GM/177ML SOLN Use as directed; may provide generic 354 mL 0   No current facility-administered medications for this visit.    Allergies as of 04/25/2022   (No Known Allergies)    Family History  Problem Relation Age of Onset   Diabetes Mother     Alcohol abuse Father    Throat cancer Father        Throat cancer:  cause of death   Drug abuse Father    Non-Hodgkin's lymphoma Sister 44       Remission   Ovarian cancer Maternal Grandmother    Breast cancer Neg Hx    Stomach cancer Neg Hx    Esophageal cancer Neg Hx    Pancreatic cancer Neg Hx     Social History   Socioeconomic History   Marital status: Single    Spouse name: Not on file   Number of children: 0   Years of education: Not on file   Highest education level: Bachelor's degree (e.g., BA, AB, BS)  Occupational History   Occupation: Corporate treasurer    Comment: unemployed.  Tobacco Use   Smoking status: Never   Smokeless tobacco: Never  Vaping Use   Vaping Use: Never used  Substance and Sexual Activity   Alcohol use: Not Currently   Drug use: Never   Sexual activity: Not Currently  Other Topics Concern   Not on file  Social History Narrative   Lives at home with her mom and youngest sister.  Other sister is living in [place].   Social Determinants of Health   Financial Resource Strain: Low Risk  (04/18/2021)   Overall Financial Resource Strain (CARDIA)    Difficulty of Paying Living Expenses: Not hard at all  Food Insecurity: No Food Insecurity (06/13/2021)   Hunger Vital Sign    Worried About Running Out of Food in the Last Year: Never true    Ran Out of Food in the Last Year: Never true  Transportation Needs: No Transportation Needs (06/13/2021)   PRAPARE - Hydrologist (Medical): No    Lack of Transportation (Non-Medical): No  Physical Activity: Not on file  Stress: No Stress Concern Present (05/24/2020)   [place]    Feeling of Stress : Only a little  Social Connections: Socially Isolated (05/27/2020)   Social Connection and Isolation Panel [NHANES]    Frequency of Communication with Friends and Family: Once a week    Frequency of Social Gatherings with  Friends and Family: Once a week    Attends Religious Services: 1 to 4 times per year    Active Member of [employer] or Organizations: No    Attends Archivist Meetings: Never    Marital Status: Never married  Intimate Partner Violence: Not At Risk (05/24/2020)   Humiliation, Afraid, Rape, and Kick questionnaire    Fear of Current or Ex-Partner: No    Emotionally Abused: No    Physically Abused: No    Sexually Abused: No    Review of Systems: 12 system ROS is negative except as noted above with the addition of hematuria, fatigue, and headaches.   Physical Exam: General:   Alert,  well-nourished, pleasant and cooperative in NAD Head:  Normocephalic and atraumatic. Eyes:  Sclera clear, no icterus.   Conjunctiva pink. Ears:  Normal auditory acuity. Nose:  No deformity, discharge,  or lesions. Mouth:  No deformity or lesions.  Neck:  Supple; no masses or thyromegaly. Lungs:  Clear throughout to auscultation.   No wheezes. Heart:  Regular rate and rhythm; no murmurs. Abdomen:  Soft, nontender, nondistended, normal bowel sounds, no rebound or guarding. No hepatosplenomegaly. No Sister Mary nodule.    Rectal:  Deferred  Msk:  Symmetrical. No boney deformities LAD: No inguinal or umbilical LAD Extremities:  No clubbing or edema. Neurologic:  Alert and  oriented x4;  grossly nonfocal Skin:  Intact without significant lesions or rashes. Psych:  Alert and cooperative. Normal mood and affect.     Valerie Ruben L. Tarri Glenn, MD, MPH 04/25/2022, 9:49 PM

## 2022-04-26 LAB — AFP TUMOR MARKER: AFP-Tumor Marker: 10.4 ng/mL — ABNORMAL HIGH

## 2022-04-26 LAB — CEA: CEA: 4 ng/mL — ABNORMAL HIGH

## 2022-04-27 ENCOUNTER — Encounter: Payer: Self-pay | Admitting: Gastroenterology

## 2022-04-27 ENCOUNTER — Ambulatory Visit (AMBULATORY_SURGERY_CENTER): Payer: No Typology Code available for payment source | Admitting: Gastroenterology

## 2022-04-27 VITALS — BP 143/79 | HR 80 | Temp 97.8°F | Resp 14 | Ht 71.0 in | Wt 161.0 lb

## 2022-04-27 DIAGNOSIS — K2960 Other gastritis without bleeding: Secondary | ICD-10-CM

## 2022-04-27 DIAGNOSIS — K254 Chronic or unspecified gastric ulcer with hemorrhage: Secondary | ICD-10-CM

## 2022-04-27 DIAGNOSIS — R195 Other fecal abnormalities: Secondary | ICD-10-CM

## 2022-04-27 DIAGNOSIS — R112 Nausea with vomiting, unspecified: Secondary | ICD-10-CM

## 2022-04-27 DIAGNOSIS — R933 Abnormal findings on diagnostic imaging of other parts of digestive tract: Secondary | ICD-10-CM

## 2022-04-27 DIAGNOSIS — K3189 Other diseases of stomach and duodenum: Secondary | ICD-10-CM

## 2022-04-27 DIAGNOSIS — K2970 Gastritis, unspecified, without bleeding: Secondary | ICD-10-CM

## 2022-04-27 DIAGNOSIS — K2951 Unspecified chronic gastritis with bleeding: Secondary | ICD-10-CM

## 2022-04-27 MED ORDER — SODIUM CHLORIDE 0.9 % IV SOLN: 500.0000 mL | Freq: Once | INTRAVENOUS | Status: DC

## 2022-04-27 MED ORDER — PANTOPRAZOLE SODIUM 40 MG PO TBEC: 40.0000 mg | DELAYED_RELEASE_TABLET | Freq: Every morning | ORAL | 3 refills | Status: DC

## 2022-04-27 NOTE — Op Note (Addendum)
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 The patient's tolerance of                            previous anesthesia was also reviewed. The risks                            and benefits of the procedure and the sedation                            options and risks were discussed with the patient.                            All questions were answered, and informed consent                            was obtained. Prior Anticoagulants: The patient has                            taken no previous anticoagulant or antiplatelet                            agents. ASA Grade Assessment: III - A patient with                            severe systemic disease. After reviewing the risks                            and benefits, the patient was deemed in  satisfactory condition to undergo the procedure.                           After obtaining informed consent, the endoscope was                            passed under direct vision. Throughout the                            procedure, the patient's blood pressure, pulse, and                            oxygen saturations were monitored continuously. The                            Endoscope was  introduced through the mouth, and                            advanced to the third part of duodenum. The upper                            GI endoscopy was accomplished without difficulty.                            The patient tolerated the procedure well. Scope In: Scope Out: Findings:                 The examined esophagus was normal.                           Diffuse moderate inflammation characterized by                            erythema, friability and granularity was found in                            the gastric fundus and in the gastric body.                            Biopsies were taken with a cold forceps for                            histology. Estimated blood loss was minimal.                           A submucosal nodule with central umbilicization                            with no bleeding and no stigmata of recent bleeding                            was found in the gastric antrum. The overlying  mucosa was normal. Tunneled biopsies were taken                            with a cold forceps for histology. Estimated blood                            loss was minimal.                           A submucosal, non-circumferential mass with no                            bleeding and no stigmata of recent bleeding was                            found in the gastric fundus. The overlying mucosa                            was normal. Biopsies were taken with a cold forceps                            for histology. Estimated blood loss was minimal. Complications:            No immediate complications. Estimated Blood Loss:     Estimated blood loss was minimal. Impression:               - Normal esophagus.                           - Gastritis. Biopsied.                           - Antral submucosal nodule. Slightly larger than a                            typical pancreatic rest.                           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  Worrisome                            for GIST. Biopsied.                           - Normal examined duodenum. Recommendation:           - Patient has a contact number available for                            emergencies. The signs and symptoms of potential                            delayed complications were discussed with the                            patient. Return to normal activities tomorrow.  Written discharge instructions were provided to the                            patient.                           - Resume previous diet.                           - Continue present medications.                           - Start pantoprazole 40 mg QAM.                           - Await pathology results.                           - No aspirin, ibuprofen, naproxen, or other                            non-steroidal anti-inflammatory drugs.                           - Consider EGD with EUS if biopsy results are                            negative. If biopsy results are positive, will plan                            referral to surgery. Thornton Park MD, MD 04/27/2022 2:01:03 PM This report has been signed electronically.

## 2022-04-27 NOTE — Progress Notes (Signed)
Indication for upper endoscopy: Abnormal CT with concerns for gastric mass Indication for colonoscopy: FIT positive  Please see my 04/25/2022 office note for complete details.  There is been no change in history or physical exam since that time.  The patient remains an appropriate candidate for monitored anesthesia care in the endoscopy center.

## 2022-04-27 NOTE — Patient Instructions (Addendum)
NO ASPIRIN, IBUPROFEN, NAPROXEN OR OTHER NON-STEROIDAL ANTI-INFLAMMATORY DRUGS.   START PANTOPRAZOLE 40 MG BY MOUTH EVERY MORNING.    YOU HAD AN ENDOSCOPIC PROCEDURE TODAY AT THE [facility]:   Refer to the procedure report that was given to you for any specific questions about what was found during the examination.  If the procedure report does not answer your questions, please call your gastroenterologist to clarify.  If you requested that your care partner not be given the details of your procedure findings, then the procedure report has been included in a sealed envelope for you to review at your convenience later.  YOU SHOULD EXPECT: Some feelings of bloating in the abdomen. Passage of more gas than usual.  Walking can help get rid of the air that was put into your GI tract during the procedure and reduce the bloating. If you had a lower endoscopy (such as a colonoscopy or flexible sigmoidoscopy) you may notice spotting of blood in your stool or on the toilet paper. If you underwent a bowel prep for your procedure, you may not have a normal bowel movement for a few days.  Please Note:  You might notice some irritation and congestion in your nose or some drainage.  This is from the oxygen used during your procedure.  There is no need for concern and it should clear up in a day or so.  SYMPTOMS TO REPORT IMMEDIATELY:  Following lower endoscopy (colonoscopy or flexible sigmoidoscopy):  Excessive amounts of blood in the stool  Significant tenderness or worsening of abdominal pains  Swelling of the abdomen that is new, acute  Fever of 100F or higher  Following upper endoscopy (EGD)  Vomiting of blood or coffee ground material  New chest pain or pain under the shoulder blades  Painful or persistently difficult swallowing  New shortness of breath  Fever of 100F or higher  Black, tarry-looking stools  For urgent or emergent issues, a gastroenterologist can be reached at any  hour by calling [phone]. Do not use MyChart messaging for urgent concerns.    DIET:  We do recommend a small meal at first, but then you may proceed to your regular diet.  Drink plenty of fluids but you should avoid alcoholic beverages for 24 hours.  ACTIVITY:  You should plan to take it easy for the rest of today and you should NOT DRIVE or use heavy machinery until tomorrow (because of the sedation medicines used during the test).    FOLLOW UP: Our staff will call the number listed on your records the next business day following your procedure.  We will call around 7:15- 8:00 am to check on you and address any questions or concerns that you may have regarding the information given to you following your procedure. If we do not reach you, we will leave a message.  If you develop any symptoms (ie: fever, flu-like symptoms, shortness of breath, cough etc.) before then, please call [phone].  If you test positive for Covid 19 in the 2 weeks post procedure, please call and report this information to [place].    If any biopsies were taken you will be contacted by phone or by letter within the next 1-3 weeks.  Please call us at [phone] if you have not heard about the biopsies in 3 weeks.    SIGNATURES/CONFIDENTIALITY: You and/or your care partner have signed paperwork which will be entered into your electronic medical record.  These signatures attest to the fact  that that the information above on your After Visit Summary has been reviewed and is understood.  Full responsibility of the confidentiality of this discharge information lies with you and/or your care-partner.

## 2022-04-27 NOTE — Progress Notes (Signed)
Called to room to assist during endoscopic procedure.  Patient ID and intended procedure confirmed with present staff. Received instructions for my participation in the procedure from the performing physician.  

## 2022-04-27 NOTE — Progress Notes (Signed)
A and O x3. Report to RN. Tolerated MAC anesthesia well.Teeth unchanged after procedure. 

## 2022-04-27 NOTE — Op Note (Signed)
[facility] Patient Name: Valerie Webb Procedure Date: 04/27/2022 1:21 PM MRN: [id number] Endoscopist: Thornton Park MD, MD Age: 61 Referring MD:  Date of Birth: 09-27-1960 Gender: Female Account #: [id number] Procedure:                Colonoscopy Indications:              Positive fecal immunochemical test                           No prior colonoscopy Medicines:                Monitored Anesthesia Care Procedure:                Pre-Anesthesia Assessment:                           - Prior to the procedure, a History and Physical                            was performed, and patient medications and                            allergies were reviewed. The patient's tolerance of                            previous anesthesia was also reviewed. The risks                            and benefits of the procedure and the sedation                            options and risks were discussed with the patient.                            All questions were answered, and informed consent                            was obtained. Prior Anticoagulants: The patient has                            taken no previous anticoagulant or antiplatelet                            agents. ASA Grade Assessment: III - A patient with                            severe systemic disease. After reviewing the risks                            and benefits, the patient was deemed in                            satisfactory condition to undergo the procedure.  After obtaining informed consent, the colonoscope                            was passed under direct vision. Throughout the                            procedure, the patient's blood pressure, pulse, and                            oxygen saturations were monitored continuously. The                            Colonoscope was introduced through the anus and                            advanced to the 3 cm into the ileum. A second                             forward view of the right colon was performed. The                            colonoscopy was technically difficult and complex                            due to a redundant colon and significant looping.                            Successful completion of the procedure was aided by                            changing the patient's position, withdrawing and                            reinserting the scope and applying abdominal                            pressure. The patient tolerated the procedure well.                            The quality of the bowel preparation was excellent.                            The terminal ileum, ileocecal valve, appendiceal                            orifice, and rectum were photographed. Scope In: 1:36:23 PM Scope Out: 1:53:17 PM Scope Withdrawal Time: 0 hours 12 minutes 37 seconds  Total Procedure Duration: 0 hours 16 minutes 54 seconds  Findings:                 The perianal and digital rectal examinations were                            normal.  There was evidence of a prior end-to-end                            colo-colonic anastomosis in the distal sigmoid                            colon. This was patent and was characterized by                            healthy appearing mucosa.                           The entire examined colon otherwise appeared                            normal. However, the colon is redundant and                            tortuous.                           The exam was otherwise without abnormality on                            direct and retroflexion views. Complications:            No immediate complications. Estimated Blood Loss:     Estimated blood loss: none. Impression:               - Patent end-to-end colo-colonic anastomosis,                            characterized by healthy appearing mucosa.                           - Redundant colon.                            - The examination was otherwise normal on direct                            and retroflexion views.                           - No specimens collected. Recommendation:           - Patient has a contact number available for                            emergencies. The signs and symptoms of potential                            delayed complications were discussed with the                            patient. Return to normal activities tomorrow.  Written discharge instructions were provided to the                            patient.                           - Resume previous diet.                           - Continue present medications.                           - Repeat colonoscopy in 10 years for surveillance,                            earlier with new symptoms.                           - Emerging evidence supports eating a diet of                            fruits, vegetables, grains, calcium, and yogurt                            while reducing red meat and alcohol may reduce the                            risk of colon cancer. Thornton Park MD, MD 04/27/2022 2:05:22 PM This report has been signed electronically.

## 2022-04-30 ENCOUNTER — Telehealth: Payer: Self-pay

## 2022-04-30 NOTE — Telephone Encounter (Signed)
Attempted to reach pt. With follow-up call following endoscopic procedure 04/27/2022.  LM on pt. Ans. Machine to call if she has any questions or concerns.

## 2022-05-14 ENCOUNTER — Encounter: Payer: No Typology Code available for payment source | Admitting: Gastroenterology

## 2022-05-24 ENCOUNTER — Telehealth: Payer: Self-pay

## 2022-05-24 NOTE — Telephone Encounter (Signed)
Mansouraty, Telford Nab., MD  Thornton Park, MD; Timothy Lasso, RN [facility],  Thank you for update.  Are you having your team work on getting the CT/MRI results uploaded?   Reann Dobias,  Please move forward with scheduling EUS radial/linear next available.  Please let Dr. Tarri Glenn and I know when that is scheduled for and if we have to make some adjustments in our schedule if necessary.  Thanks.  GM

## 2022-05-25 ENCOUNTER — Telehealth: Payer: Self-pay

## 2022-05-25 ENCOUNTER — Other Ambulatory Visit: Payer: Self-pay

## 2022-05-25 DIAGNOSIS — K3189 Other diseases of stomach and duodenum: Secondary | ICD-10-CM

## 2022-05-25 NOTE — Telephone Encounter (Signed)
Left detailed message for [facility] to return call in regards to uploading images from recent scans.

## 2022-05-25 NOTE — Telephone Encounter (Signed)
EUS scheduled, pt instructed and medications reviewed.  Patient instructions mailed to home and sent to My Chart .  Patient to call with any questions or concerns.  

## 2022-05-25 NOTE — Telephone Encounter (Signed)
The pt has been scheduled for EUS on 06/28/22 at 245 pm at [facility] with [facility].    Left message on machine to call back

## 2022-05-29 NOTE — Telephone Encounter (Signed)
Unable to reach front desk member at [place]. Will try again at a later time.

## 2022-05-30 NOTE — Telephone Encounter (Signed)
Spoke with Thornton Dales from [place] she stated she would have medical records upload images through [place] to [employer].

## 2022-06-06 NOTE — Telephone Encounter (Signed)
Spoke with [employer] & they have not received images from [facility] through [place]. Left detailed message for [facility] medical records to please return call.

## 2022-06-08 NOTE — Telephone Encounter (Signed)
Left detailed message for medical records.

## 2022-06-12 NOTE — Telephone Encounter (Signed)
Unable to speak with anyone in medical records & have left multiple detailed messages. Per front desk, they will have medical records upload images into [employer].

## 2022-06-15 NOTE — Telephone Encounter (Signed)
Unable to reach [employer] to see if they are able to view images on [employer] x 3. Will try again at a later time.

## 2022-06-19 ENCOUNTER — Other Ambulatory Visit: Payer: Self-pay

## 2022-06-19 ENCOUNTER — Ambulatory Visit
Admission: RE | Admit: 2022-06-19 | Discharge: 2022-06-19 | Disposition: A | Discharge status: Home / Self Care | Payer: Self-pay | Source: Ambulatory Visit | Attending: Gastroenterology | Admitting: Gastroenterology

## 2022-06-19 DIAGNOSIS — K3189 Other diseases of stomach and duodenum: Secondary | ICD-10-CM

## 2022-06-19 NOTE — Telephone Encounter (Signed)
Spoke with [facility] medical records & they stated images were uploaded, however the patient's last name was spelt differently in their system. [employer] has been notified & outside film order placed. Images should be available in epic.

## 2022-06-21 ENCOUNTER — Encounter (HOSPITAL_COMMUNITY): Payer: Self-pay | Admitting: Gastroenterology

## 2022-06-28 ENCOUNTER — Encounter (HOSPITAL_COMMUNITY): Payer: Self-pay | Admitting: Gastroenterology

## 2022-06-28 ENCOUNTER — Ambulatory Visit (HOSPITAL_COMMUNITY): Payer: Self-pay | Admitting: Certified Registered"

## 2022-06-28 ENCOUNTER — Encounter (HOSPITAL_COMMUNITY)
Admission: RE | Disposition: A | Discharge status: Home / Self Care | Payer: Self-pay | Source: Ambulatory Visit | Attending: Gastroenterology

## 2022-06-28 ENCOUNTER — Other Ambulatory Visit: Payer: Self-pay

## 2022-06-28 ENCOUNTER — Ambulatory Visit (HOSPITAL_COMMUNITY)
Admission: RE | Admit: 2022-06-28 | Discharge: 2022-06-28 | Disposition: A | Discharge status: Home / Self Care | Payer: Self-pay | Source: Ambulatory Visit | Attending: Gastroenterology | Admitting: Gastroenterology

## 2022-06-28 ENCOUNTER — Ambulatory Visit (HOSPITAL_BASED_OUTPATIENT_CLINIC_OR_DEPARTMENT_OTHER): Payer: Self-pay | Admitting: Certified Registered"

## 2022-06-28 DIAGNOSIS — K2971 Gastritis, unspecified, with bleeding: Secondary | ICD-10-CM

## 2022-06-28 DIAGNOSIS — K668 Other specified disorders of peritoneum: Secondary | ICD-10-CM

## 2022-06-28 DIAGNOSIS — K862 Cyst of pancreas: Secondary | ICD-10-CM

## 2022-06-28 DIAGNOSIS — K7689 Other specified diseases of liver: Secondary | ICD-10-CM

## 2022-06-28 DIAGNOSIS — K2970 Gastritis, unspecified, without bleeding: Secondary | ICD-10-CM | POA: Insufficient documentation

## 2022-06-28 DIAGNOSIS — K3189 Other diseases of stomach and duodenum: Secondary | ICD-10-CM

## 2022-06-28 DIAGNOSIS — I899 Noninfective disorder of lymphatic vessels and lymph nodes, unspecified: Secondary | ICD-10-CM

## 2022-06-28 DIAGNOSIS — K8689 Other specified diseases of pancreas: Secondary | ICD-10-CM | POA: Insufficient documentation

## 2022-06-28 DIAGNOSIS — D490 Neoplasm of unspecified behavior of digestive system: Secondary | ICD-10-CM

## 2022-06-28 DIAGNOSIS — K259 Gastric ulcer, unspecified as acute or chronic, without hemorrhage or perforation: Secondary | ICD-10-CM | POA: Insufficient documentation

## 2022-06-28 DIAGNOSIS — R110 Nausea: Secondary | ICD-10-CM | POA: Insufficient documentation

## 2022-06-28 DIAGNOSIS — K2289 Other specified disease of esophagus: Secondary | ICD-10-CM

## 2022-06-28 HISTORY — PX: FINE NEEDLE ASPIRATION: SHX5430

## 2022-06-28 HISTORY — PX: BIOPSY: SHX5522

## 2022-06-28 HISTORY — PX: EUS: SHX5427

## 2022-06-28 HISTORY — PX: ESOPHAGOGASTRODUODENOSCOPY (EGD) WITH PROPOFOL: SHX5813

## 2022-06-28 HISTORY — DX: Headache, unspecified: R51.9

## 2022-06-28 SURGERY — UPPER ENDOSCOPIC ULTRASOUND (EUS) RADIAL
Anesthesia: Monitor Anesthesia Care

## 2022-06-28 MED ORDER — PANTOPRAZOLE SODIUM 40 MG PO TBEC: 40.0000 mg | DELAYED_RELEASE_TABLET | Freq: Two times a day (BID) | ORAL | 12 refills | Status: DC

## 2022-06-28 MED ORDER — LACTATED RINGERS IV SOLN: INTRAVENOUS | Status: DC

## 2022-06-28 MED ORDER — PHENYLEPHRINE HCL (PRESSORS) 10 MG/ML IV SOLN
INTRAVENOUS | Status: DC | PRN
  Administered 2022-06-28: 160 ug via INTRAVENOUS

## 2022-06-28 MED ORDER — PROPOFOL 500 MG/50ML IV EMUL
INTRAVENOUS | Status: DC | PRN
  Administered 2022-06-28: 150 ug/kg/min via INTRAVENOUS

## 2022-06-28 MED ORDER — PROPOFOL 10 MG/ML IV BOLUS
INTRAVENOUS | Status: DC | PRN
  Administered 2022-06-28: 10 mg via INTRAVENOUS
  Administered 2022-06-28: 20 mg via INTRAVENOUS
  Administered 2022-06-28: 60 mg via INTRAVENOUS
  Administered 2022-06-28: 50 mg via INTRAVENOUS

## 2022-06-28 MED ORDER — SODIUM CHLORIDE 0.9 % IV SOLN: INTRAVENOUS | Status: DC

## 2022-06-28 MED ORDER — LIDOCAINE 2% (20 MG/ML) 5 ML SYRINGE
INTRAMUSCULAR | Status: DC | PRN
  Administered 2022-06-28: 40 mg via INTRAVENOUS
  Administered 2022-06-28: 20 mg via INTRAVENOUS

## 2022-06-28 NOTE — Anesthesia Preprocedure Evaluation (Addendum)
Anesthesia Evaluation  Patient identified by MRN, date of birth, ID band Patient awake    Reviewed: Allergy & Precautions, NPO status , Patient's Chart, lab work & pertinent test results  Airway Mallampati: II       Dental   Pulmonary    breath sounds clear to auscultation       Cardiovascular negative cardio ROS   Rhythm:Regular Rate:Normal     Neuro/Psych  Headaches,    GI/Hepatic Neg liver ROS, Hx noted Dr. Nyoka Cowden   Endo/Other  negative endocrine ROS  Renal/GU negative Renal ROS     Musculoskeletal   Abdominal   Peds  Hematology   Anesthesia Other Findings   Reproductive/Obstetrics                             Anesthesia Physical Anesthesia Plan  ASA: 3  Anesthesia Plan: MAC   Post-op Pain Management:    Induction: Intravenous  PONV Risk Score and Plan: 2 and Treatment may vary due to age or medical condition  Airway Management Planned: Nasal Cannula and Simple Face Mask  Additional Equipment:   Intra-op Plan:   Post-operative Plan:   Informed Consent: I have reviewed the patients History and Physical, chart, labs and discussed the procedure including the risks, benefits and alternatives for the proposed anesthesia with the patient or authorized representative who has indicated his/her understanding and acceptance.     Dental advisory given  Plan Discussed with: CRNA and Anesthesiologist  Anesthesia Plan Comments:         Anesthesia Quick Evaluation

## 2022-06-28 NOTE — Transfer of Care (Signed)
Immediate Anesthesia Transfer of Care Note  Patient: Valerie Webb  Procedure(s) Performed: UPPER ENDOSCOPIC ULTRASOUND (EUS) RADIAL ESOPHAGOGASTRODUODENOSCOPY (EGD) WITH PROPOFOL BIOPSY FINE NEEDLE ASPIRATION (FNA) LINEAR  Patient Location: PACU and Endoscopy Unit  Anesthesia Type:MAC  Level of Consciousness: awake and alert   Airway & Oxygen Therapy: Patient Spontanous Breathing and Patient connected to face mask oxygen  Post-op Assessment: Report given to RN and Post -op Vital signs reviewed and stable  Post vital signs: Reviewed  Last Vitals:  Vitals Value Taken Time  BP 143/80 06/28/22 1546  Temp    Pulse 95 06/28/22 1548  Resp 17 06/28/22 1548  SpO2 99 % 06/28/22 1548  Vitals shown include unvalidated device data.  Last Pain:  Vitals:   06/28/22 1351  TempSrc: Temporal  PainSc: 8          Complications: No notable events documented.

## 2022-06-28 NOTE — Anesthesia Procedure Notes (Signed)
Procedure Name: MAC Date/Time: 06/28/2022 2:43 PM  Performed by: Cynda Familia, CRNAPre-anesthesia Checklist: Patient identified, Emergency Drugs available, Suction available, Patient being monitored and Timeout performed Patient Re-evaluated:Patient Re-evaluated prior to induction Oxygen Delivery Method: Simple face mask Placement Confirmation: positive ETCO2 and breath sounds checked- equal and bilateral Dental Injury: Teeth and Oropharynx as per pre-operative assessment

## 2022-06-28 NOTE — H&P (Signed)
GASTROENTEROLOGY PROCEDURE H&P NOTE   Primary Care Physician: Mack Hook, MD  HPI: Valerie Webb is a 60 y.o. female who presents for EGD/EUS to evaluate an exophytic subepithelial gastric lesion noted on CT and MRI with negative EGD biopsies.  Past Medical History:  Diagnosis Date   Congenital redundant colon 2008   Endometriosis 2001   Found on exploratory laparoscopic surgery.   Headache    migraines   Heart murmur 1993   Normal Echocardiogram 2021   Past Surgical History:  Procedure Laterality Date   ABLATION ON ENDOMETRIOSIS  2008   BREAST EXCISIONAL BIOPSY Right    years ago benign   COLON SURGERY  2008   Removal of redundant colon found on surgery for removal of endometriosis surgery   Exploratory Laparoscopic surgery  2001   Pelvic and abdominal pain   Current Facility-Administered Medications  Medication Dose Route Frequency Provider Last Rate Last Admin   0.9 %  sodium chloride infusion   Intravenous Continuous Mansouraty, Telford Nab., MD        Current Facility-Administered Medications:    0.9 %  sodium chloride infusion, , Intravenous, Continuous, Mansouraty, Telford Nab., MD No Known Allergies Family History  Problem Relation Age of Onset   Diabetes Mother    Alcohol abuse Father    Throat cancer Father        Throat cancer:  cause of death   Drug abuse Father    Non-Hodgkin's lymphoma Sister 12       Remission   Ovarian cancer Maternal Grandmother    Breast cancer Neg Hx    Stomach cancer Neg Hx    Esophageal cancer Neg Hx    Pancreatic cancer Neg Hx    Colon cancer Neg Hx    Social History   Socioeconomic History   Marital status: Single    Spouse name: Not on file   Number of children: 0   Years of education: Not on file   Highest education level: Bachelor's degree (e.g., BA, AB, BS)  Occupational History   Occupation: Corporate treasurer    Comment: unemployed.  Tobacco Use   Smoking status: Never   Smokeless  tobacco: Never  Vaping Use   Vaping Use: Never used  Substance and Sexual Activity   Alcohol use: Not Currently   Drug use: Never   Sexual activity: Not Currently  Other Topics Concern   Not on file  Social History Narrative   Lives at home with her mom and youngest sister.  Other sister is living in [place].   Social Determinants of Health   Financial Resource Strain: Low Risk  (04/18/2021)   Overall Financial Resource Strain (CARDIA)    Difficulty of Paying Living Expenses: Not hard at all  Food Insecurity: No Food Insecurity (06/13/2021)   Hunger Vital Sign    Worried About Running Out of Food in the Last Year: Never true    Ran Out of Food in the Last Year: Never true  Transportation Needs: No Transportation Needs (06/13/2021)   PRAPARE - Hydrologist (Medical): No    Lack of Transportation (Non-Medical): No  Physical Activity: Not on file  Stress: No Stress Concern Present (05/24/2020)   [place]    Feeling of Stress : Only a little  Social Connections: Socially Isolated (05/27/2020)   Social Connection and Isolation Panel [NHANES]    Frequency of Communication with Friends and Family: Once a  week    Frequency of Social Gatherings with Friends and Family: Once a week    Attends Religious Services: 1 to 4 times per year    Active Member of [employer] or Organizations: No    Attends Archivist Meetings: Never    Marital Status: Never married  Intimate Partner Violence: Not At Risk (05/24/2020)   Humiliation, Afraid, Rape, and Kick questionnaire    Fear of Current or Ex-Partner: No    Emotionally Abused: No    Physically Abused: No    Sexually Abused: No    Physical Exam: Today's Vitals   06/21/22 1451 06/28/22 1351  BP:  (!) 169/100  Pulse:  80  Resp:  12  Temp:  97.8 F (36.6 C)  TempSrc:  Temporal  SpO2:  96%  Weight: 74.8 kg 74.8 kg  Height: '5\' 11"'$  (1.803 m) '5\' 11"'$   (1.803 m)  PainSc:  8    Body mass index is 23.01 kg/m. GEN: NAD EYE: Sclerae anicteric ENT: MMM CV: Non-tachycardic GI: Soft, NT/ND NEURO:  Alert & Oriented x 3  Lab Results: No results for input(s): "WBC", "HGB", "HCT", "PLT" in the last 72 hours. BMET No results for input(s): "NA", "K", "CL", "CO2", "GLUCOSE", "BUN", "CREATININE", "CALCIUM" in the last 72 hours. LFT No results for input(s): "PROT", "ALBUMIN", "AST", "ALT", "ALKPHOS", "BILITOT", "BILIDIR", "IBILI" in the last 72 hours. PT/INR No results for input(s): "LABPROT", "INR" in the last 72 hours.   Impression / Plan: This is a 61 y.o.female who presents for EGD/EUS to evaluate an exophytic subepithelial gastric lesion noted on CT and MRI with negative EGD biopsies.  The risks of an EUS including intestinal perforation, bleeding, infection, aspiration, and medication effects were discussed as was the possibility it may not give a definitive diagnosis if a biopsy is performed.  When a biopsy of the pancreas is done as part of the EUS, there is an additional risk of pancreatitis at the rate of about 1-2%.  It was explained that procedure related pancreatitis is typically mild, although it can be severe and even life threatening, which is why we do not perform random pancreatic biopsies and only biopsy a lesion/area we feel is concerning enough to warrant the risk.  The risks and benefits of endoscopic evaluation/treatment were discussed with the patient and/or family; these include but are not limited to the risk of perforation, infection, bleeding, missed lesions, lack of diagnosis, severe illness requiring hospitalization, as well as anesthesia and sedation related illnesses.  The patient's history has been reviewed, patient examined, no change in status, and deemed stable for procedure.  The patient and/or family is agreeable to proceed.    Justice Britain, MD [facility] Office #  [phone]

## 2022-06-28 NOTE — Discharge Instructions (Signed)
YOU HAD AN ENDOSCOPIC PROCEDURE TODAY: Refer to the procedure report and other information in the discharge instructions given to you for any specific questions about what was found during the examination. If this information does not answer your questions, please call [place] office at [phone] to clarify.   YOU SHOULD EXPECT: Some feelings of bloating in the abdomen. Passage of more gas than usual. Walking can help get rid of the air that was put into your GI tract during the procedure and reduce the bloating. If you had a lower endoscopy (such as a colonoscopy or flexible sigmoidoscopy) you may notice spotting of blood in your stool or on the toilet paper. Some abdominal soreness may be present for a day or two, also.  DIET: Your first meal following the procedure should be a light meal and then it is ok to progress to your normal diet. A half-sandwich or bowl of soup is an example of a good first meal. Heavy or fried foods are harder to digest and may make you feel nauseous or bloated. Drink plenty of fluids but you should avoid alcoholic beverages for 24 hours. If you had a esophageal dilation, please see attached instructions for diet.    ACTIVITY: Your care partner should take you home directly after the procedure. You should plan to take it easy, moving slowly for the rest of the day. You can resume normal activity the day after the procedure however YOU SHOULD NOT DRIVE, use power tools, machinery or perform tasks that involve climbing or major physical exertion for 24 hours (because of the sedation medicines used during the test).   SYMPTOMS TO REPORT IMMEDIATELY: A gastroenterologist can be reached at any hour. Please call [phone]  for any of the following symptoms:   Following upper endoscopy (EGD, EUS, ERCP, esophageal dilation) Vomiting of blood or coffee ground material  New, significant abdominal pain  New, significant chest pain or pain under the shoulder blades  Painful or  persistently difficult swallowing  New shortness of breath  Black, tarry-looking or red, bloody stools  FOLLOW UP:  If any biopsies were taken you will be contacted by phone or by letter within the next 1-3 weeks. Call [phone]  if you have not heard about the biopsies in 3 weeks.  Please also call with any specific questions about appointments or follow up tests.  

## 2022-06-28 NOTE — Op Note (Signed)
[facility] Patient Name: Valerie Webb Procedure Date: 06/28/2022 MRN: [id number] Attending MD: Justice Britain , MD Date of Birth: 09-25-1960 CSN: [id number] Age: 61 Admit Type: Outpatient Procedure:                Upper EUS Indications:              Gastric deformity on endoscopy/Subepithelial tumor                            versus extrinsic compression, Suspected mass in                            stomach on CT scan, Suspected mass in stomach on                            MRI, Nausea Providers:                Justice Britain, MD, Dulcy Fanny, Cletis Athens, Technician Referring MD:             Thornton Park MD, MD Medicines:                Monitored Anesthesia Care Complications:            No immediate complications. Estimated Blood Loss:     Estimated blood loss was minimal. Procedure:                Pre-Anesthesia Assessment:                           - Prior to the procedure, a History and Physical                            was performed, and patient medications and                            allergies were reviewed. The patient's tolerance of                            previous anesthesia was also reviewed. The risks                            and benefits of the procedure and the sedation                            options and risks were discussed with the patient.                            All questions were answered, and informed consent                            was obtained. Prior Anticoagulants: The patient has                            taken  no previous anticoagulant or antiplatelet                            agents except for NSAID medication. ASA Grade                            Assessment: II - A patient with mild systemic                            disease. After reviewing the risks and benefits,                            the patient was deemed in satisfactory condition to                             undergo the procedure.                           After obtaining informed consent, the endoscope was                            passed under direct vision. Throughout the                            procedure, the patient's blood pressure, pulse, and                            oxygen saturations were monitored continuously. The                            [id number]-H190 ([id number]) Olympus endoscope was introduced                            through the mouth, and advanced to the second part                            of duodenum. The GF-UE190-AL5 ([id number]) Olympus                            radial ultrasound scope was introduced through the                            mouth, and advanced to the stomach for ultrasound                            examination from the esophagus and stomach. The                            [id number] ([id number]) Olympus linear ultrasound scope                            was introduced through the mouth, and advanced to  the stomach for ultrasound examination from the                            esophagus and stomach. The upper EUS was                            accomplished without difficulty. The patient                            tolerated the procedure. Scope In: Scope Out: Findings:      ENDOSCOPIC FINDING: :      No gross lesions were noted in the proximal esophagus and in the mid       esophagus.      Localized moderate mucosal changes characterized by sloughing and       altered texture were found in the distal esophagus. Biopsies were taken       with a cold forceps for histology.      The Z-line was regular and was found 38 cm from the incisors.      A medium-sized, submucosal lesion with no bleeding and no stigmata of       recent bleeding was found in the gastric fundus.      Segmental moderate inflammation with hematin characterized by erosions,       friability, granularity were found in the entire examined stomach. There       was 1  shallow ulcer in the antrum. This stomach had already been       biopsied previously and negative for H. pylori in August so it was not       rebiopsied.      No gross lesions were noted in the duodenal bulb, in the first portion       of the duodenum and in the second portion of the duodenum.      ENDOSONOGRAPHIC FINDING: :      A rounded lesion was identified endosonographically in the perigastric       peritoneal space near the fundus. The mass was heterogenous but mostly       hypoechoic and some internal calcifications were noted (shadowing). The       mass measured 35 mm by 24 mm in maximal cross-sectional diameter. The       endosonographic borders were well-defined. An intact interface was seen       between the mass and the adjacent structures suggesting a lack of       invasion. I did not visualize this coming off of the muscle layer but       rather from the area just adjacent to the stomach wall so query the       possibility of adventitia. It was near the spleen and the left kidney.       Fine needle biopsy was performed of the lesion. Color Doppler imaging       was utilized prior to needle puncture to confirm a lack of significant       vascular structures within the needle path. Five passes were made with       the [place] acquire 22 gauge ultrasound core biopsy needle       using a transgastric approach. It was difficult due to the calcification       to get deep biopsies and then positioning changes occurred where  some       bowel loop became closer to the lesion itself making further biopsies       difficult. Visible cores of tissue were obtained. Final cytology results       are pending.      An anechoic lesion suggestive of a cyst was identified in the pancreatic       tail. It is not in obvious communication with the pancreatic duct. The       lesion measured 4 mm by 3 mm in maximal cross-sectional diameter. There       was a single compartment There was no  associated mass. There was no       internal debris within the fluid-filled cavity.      There was otherwise no sign of significant endosonographic abnormality       in the genu of the pancreas, pancreatic body and pancreatic tail.      A cyst was found in the visualized portion of the left lobe liver and       measured 13 mm by 10 mm in maximal cross-sectional diameter. The cyst       was anechoic.      Otherwise endosonographic imaging in the visualized portion of the liver       showed no mass.      No malignant-appearing lymph nodes were visualized in the celiac region       (level 20), peripancreatic region and porta hepatis region.      The celiac region was visualized. Impression:               EGD impression:                           - No gross lesions in esophagus proximally.                           - Texture changed mucosa in the very distal                            esophagus. Biopsied.                           - Z-line regular, 38 cm from the incisors.                           - Gastric subepithelial lesion noted in the gastric                            fundus.                           - Gastritis with hematin noted throughout. A                            gastric ulcer noted in the antrum.                           - No gross lesions in the duodenal bulb, in the  first portion of the duodenum and in the second                            portion of the duodenum.                           EUS impression:                           - A mass measuring 35 mm by 24 mm was identified                            endosonographically in the peritoneal cavity. This                            did not have evidence of attachment to the stomach                            itself. Cytology results are pending. However, the                            endosonographic appearance is suspicious for the                            possibility of an exophytic  stromal cell (smooth                            muscle) neoplasm. Fine needle biopsy performed.                           - A cystic lesion was seen in the pancreatic tail.                            Otherwise there was no sign of significant                            pathology in the genu of the pancreas, pancreatic                            body and pancreatic tail (the pancreatic head was                            not visualized today).                           - A cyst was found in the visualized portion of the                            liver and measured 13 mm by 10 mm. But no other                            masses or lesions were noted.                           -  No malignant-appearing lymph nodes were                            visualized in the celiac region (level 20),                            peripancreatic region and porta hepatis region. Moderate Sedation:      Not Applicable - Patient had care per Anesthesia. Recommendation:           - The patient will be observed post-procedure,                            until all discharge criteria are met.                           - Discharge patient to home.                           - Patient has a contact number available for                            emergencies. The signs and symptoms of potential                            delayed complications were discussed with the                            patient. Return to normal activities tomorrow.                            Written discharge instructions were provided to the                            patient.                           - Resume previous diet.                           - Observe patient's clinical course.                           - Await cytology results and await path results.                           - Increase to PPI to 40 mg twice daily.                           - Minimize NSAID use as able (patient has Excedrin                            on her  medication list).                           - Consider repeat EGD in 4 months to consider  follow-up of gastric ulcer.                           - Recommend MRI/MRCP in 1 year to follow-up                            pancreatic cyst.                           - Pending pathology will discuss follow-up.                           - The findings and recommendations were discussed                            with the patient.                           - The findings and recommendations were discussed                            with the designated responsible adult. Procedure Code(s):        --- Professional ---                           [phone], Esophagogastroduodenoscopy, flexible,                            transoral; with transendoscopic ultrasound-guided                            intramural or transmural fine needle                            aspiration/biopsy(s), (includes endoscopic                            ultrasound examination limited to the esophagus,                            stomach or duodenum, and adjacent structures) Diagnosis Code(s):        --- Professional ---                           K22.8, Other specified diseases of esophagus                           D49.0, Neoplasm of unspecified behavior of                            digestive system                           K29.71, Gastritis, unspecified, with bleeding                           K66.8, Other specified disorders of peritoneum  K86.2, Cyst of pancreas                           K76.89, Other specified diseases of liver                           I89.9, Noninfective disorder of lymphatic vessels                            and lymph nodes, unspecified                           K31.89, Other diseases of stomach and duodenum                           R93.3, Abnormal findings on diagnostic imaging of                            other parts of digestive tract                            R11.0, Nausea CPT copyright 2019 American Medical Association. All rights reserved. The codes documented in this report are preliminary and upon coder review may  be revised to meet current compliance requirements. Justice Britain, MD 06/28/2022 3:56:48 PM Number of Addenda: 0

## 2022-06-28 NOTE — Anesthesia Postprocedure Evaluation (Signed)
Anesthesia Post Note  Patient: Kambrey Hagger Mcanany  Procedure(s) Performed: UPPER ENDOSCOPIC ULTRASOUND (EUS) RADIAL ESOPHAGOGASTRODUODENOSCOPY (EGD) WITH PROPOFOL BIOPSY FINE NEEDLE ASPIRATION (FNA) LINEAR     Patient location during evaluation: Endoscopy Anesthesia Type: MAC Level of consciousness: awake Pain management: pain level controlled Vital Signs Assessment: post-procedure vital signs reviewed and stable Respiratory status: spontaneous breathing Cardiovascular status: stable Postop Assessment: no apparent nausea or vomiting Anesthetic complications: no   No notable events documented.  Last Vitals:  Vitals:   06/28/22 1600 06/28/22 1609  BP: (!) 160/89 (!) 155/85  Pulse: 76 74  Resp: 17 16  Temp:    SpO2: 100% 100%    Last Pain:  Vitals:   06/28/22 1609  TempSrc:   PainSc: 0-No pain                 Isabella Ida

## 2022-07-02 ENCOUNTER — Encounter: Payer: Self-pay | Admitting: Gastroenterology

## 2022-07-02 ENCOUNTER — Encounter (HOSPITAL_COMMUNITY): Payer: Self-pay | Admitting: Gastroenterology

## 2022-07-02 LAB — SURGICAL PATHOLOGY

## 2022-07-03 LAB — CYTOLOGY - NON PAP

## 2022-08-14 ENCOUNTER — Encounter (HOSPITAL_COMMUNITY): Payer: Self-pay | Admitting: Gastroenterology

## 2022-08-23 ENCOUNTER — Ambulatory Visit (HOSPITAL_COMMUNITY): Payer: Self-pay | Admitting: Certified Registered Nurse Anesthetist

## 2022-08-23 ENCOUNTER — Ambulatory Visit (HOSPITAL_BASED_OUTPATIENT_CLINIC_OR_DEPARTMENT_OTHER): Payer: Self-pay | Admitting: Certified Registered Nurse Anesthetist

## 2022-08-23 ENCOUNTER — Ambulatory Visit (HOSPITAL_COMMUNITY)
Admission: RE | Admit: 2022-08-23 | Discharge: 2022-08-23 | Disposition: A | Discharge status: Home / Self Care | Payer: Self-pay | Attending: Gastroenterology | Admitting: Gastroenterology

## 2022-08-23 ENCOUNTER — Encounter (HOSPITAL_COMMUNITY)
Admission: RE | Disposition: A | Discharge status: Home / Self Care | Payer: Self-pay | Source: Home / Self Care | Attending: Gastroenterology

## 2022-08-23 ENCOUNTER — Other Ambulatory Visit: Payer: Self-pay

## 2022-08-23 ENCOUNTER — Encounter (HOSPITAL_COMMUNITY): Payer: Self-pay | Admitting: Gastroenterology

## 2022-08-23 DIAGNOSIS — R935 Abnormal findings on diagnostic imaging of other abdominal regions, including retroperitoneum: Secondary | ICD-10-CM | POA: Insufficient documentation

## 2022-08-23 DIAGNOSIS — K2289 Other specified disease of esophagus: Secondary | ICD-10-CM | POA: Insufficient documentation

## 2022-08-23 DIAGNOSIS — K449 Diaphragmatic hernia without obstruction or gangrene: Secondary | ICD-10-CM

## 2022-08-23 DIAGNOSIS — K668 Other specified disorders of peritoneum: Secondary | ICD-10-CM | POA: Insufficient documentation

## 2022-08-23 DIAGNOSIS — C49A0 Gastrointestinal stromal tumor, unspecified site: Secondary | ICD-10-CM

## 2022-08-23 DIAGNOSIS — R933 Abnormal findings on diagnostic imaging of other parts of digestive tract: Secondary | ICD-10-CM | POA: Insufficient documentation

## 2022-08-23 DIAGNOSIS — K3189 Other diseases of stomach and duodenum: Secondary | ICD-10-CM | POA: Insufficient documentation

## 2022-08-23 HISTORY — PX: EUS: SHX5427

## 2022-08-23 HISTORY — PX: ESOPHAGOGASTRODUODENOSCOPY (EGD) WITH PROPOFOL: SHX5813

## 2022-08-23 HISTORY — PX: FINE NEEDLE ASPIRATION: SHX5430

## 2022-08-23 SURGERY — UPPER ENDOSCOPIC ULTRASOUND (EUS) RADIAL
Anesthesia: Monitor Anesthesia Care

## 2022-08-23 MED ORDER — ONDANSETRON HCL 4 MG/2ML IJ SOLN
INTRAMUSCULAR | Status: DC | PRN
  Administered 2022-08-23: 4 mg via INTRAVENOUS

## 2022-08-23 MED ORDER — PROPOFOL 500 MG/50ML IV EMUL
INTRAVENOUS | Status: DC | PRN
  Administered 2022-08-23: 125 ug/kg/min via INTRAVENOUS

## 2022-08-23 MED ORDER — CIPROFLOXACIN IN D5W 400 MG/200ML IV SOLN
INTRAVENOUS | Status: DC | PRN
  Administered 2022-08-23: 400 mg via INTRAVENOUS

## 2022-08-23 MED ORDER — SODIUM CHLORIDE 0.9 % IV SOLN
INTRAVENOUS | Status: AC | PRN
  Administered 2022-08-23: 1000 mL via INTRAMUSCULAR

## 2022-08-23 MED ORDER — DEXMEDETOMIDINE HCL IN NACL 80 MCG/20ML IV SOLN
INTRAVENOUS | Status: DC | PRN
  Administered 2022-08-23: 4 ug via BUCCAL

## 2022-08-23 MED ORDER — LIDOCAINE 2% (20 MG/ML) 5 ML SYRINGE
INTRAMUSCULAR | Status: DC | PRN
  Administered 2022-08-23: 80 mg via INTRAVENOUS

## 2022-08-23 MED ORDER — SODIUM CHLORIDE 0.9 % IV SOLN: INTRAVENOUS | Status: DC

## 2022-08-23 MED ORDER — CIPROFLOXACIN IN D5W 400 MG/200ML IV SOLN
INTRAVENOUS | Status: AC
  Filled 2022-08-23: qty 200

## 2022-08-23 MED ORDER — PROPOFOL 10 MG/ML IV BOLUS
INTRAVENOUS | Status: DC | PRN
  Administered 2022-08-23: 10 mg via INTRAVENOUS
  Administered 2022-08-23 (×3): 20 mg via INTRAVENOUS

## 2022-08-23 NOTE — Anesthesia Postprocedure Evaluation (Signed)
Anesthesia Post Note  Patient: Valerie Webb  Procedure(s) Performed: UPPER ENDOSCOPIC ULTRASOUND (EUS) RADIAL FINE NEEDLE ASPIRATION (FNA) LINEAR     Patient location during evaluation: Endoscopy Anesthesia Type: MAC Level of consciousness: awake and alert Pain management: pain level controlled Vital Signs Assessment: post-procedure vital signs reviewed and stable Respiratory status: spontaneous breathing, nonlabored ventilation, respiratory function stable and patient connected to nasal cannula oxygen Cardiovascular status: blood pressure returned to baseline and stable Postop Assessment: no apparent nausea or vomiting Anesthetic complications: no  No notable events documented.  Last Vitals:  Vitals:   08/23/22 1000 08/23/22 1010  BP: (!) 150/75 (!) 158/89  Pulse: 72 75  Resp: 18 17  Temp:    SpO2: 96% 95%    Last Pain:  Vitals:   08/23/22 1010  TempSrc:   PainSc: 0-No pain                 [name]

## 2022-08-23 NOTE — Op Note (Signed)
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 Amil Amen Medicines:                Monitored Anesthesia Care, Cipro 381 mg IV Complications:            No immediate complications. Estimated Blood Loss:     Estimated blood loss was minimal. Procedure:                Pre-Anesthesia Assessment:                           - Prior to the procedure, a History and Physical                            was performed, and patient medications and                            allergies were reviewed. The patient's tolerance of                            previous anesthesia was also reviewed. The risks                            and benefits of the procedure and the sedation                            options and risks were discussed with the patient.                            All questions were answered, and informed consent                            was obtained. Prior Anticoagulants: The patient has  taken no anticoagulant or antiplatelet agents. ASA                            Grade Assessment: II - A patient with mild systemic                            disease. After reviewing the risks and benefits,                            the patient was deemed in satisfactory condition to                            undergo the  procedure.                           After obtaining informed consent, the endoscope was                            passed under direct vision. Throughout the                            procedure, the patient's blood pressure, pulse, and                            oxygen saturations were monitored continuously. The                            [id number]-[id number] ([id number]) Olympus endoscope was introduced                            through the mouth, and advanced to the second part                            of duodenum. The [id number] ([id number]) Olympus                            Forward View EUS was introduced through the mouth,                            and advanced to the stomach for ultrasound                            examination. The upper EUS was accomplished without                            difficulty. The patient tolerated the procedure. Scope In: Scope Out: Findings:      ENDOSCOPIC FINDING: :      No gross lesions were noted in the entire esophagus.      The Z-line was irregular and was found 38 cm from the incisors.      A 2 cm hiatal hernia was present.      Extrinsic compression/impression on the stomach was found in the gastric       fundus region.      No other  gross lesions were noted in the entire examined stomach.      No gross lesions were noted in the duodenal bulb, in the first portion       of the duodenum and in the second portion of the duodenum.      ENDOSONOGRAPHIC FINDING: :      A rounded mass/lesion was identified endosonographically in the       perigastric peritoneal space adjacent to the fundus. The mass was       hypoechoic and had some calcification. The mass measured 29 mm by 22 mm       in maximal cross-sectional diameter. The endosonographic borders were       well-defined. An intact interface was seen between the mass and the       adjacent structures suggesting a lack of invasion and more suggestive of       an adventitial origin. Fine needle biopsy was  performed. Color Doppler       imaging was utilized prior to needle puncture to confirm a lack of       significant vascular structures within the needle path. Seven passes       were made with the 22 gauge SharkCore biopsy needle using a transgastric       approach. A visible core of tissue was obtained. Preliminary cytologic       examination and touch preps were performed. Final cytology results are       pending. Impression:               EGD impression:                           - No gross lesions in the entire esophagus. Z-line                            irregular, 38 cm from the incisors.                           - 2 cm hiatal hernia.                           - Extrinsic compression/impression in the gastric                            fundus.                           - No other gross lesions in the entire stomach.                           - No gross lesions in the duodenal bulb, in the                            first portion of the duodenum and in the second                            portion of the duodenum.                           EUS impression:                           -  A mass measuring 29 mm by 22 mm was identified                            endosonographically in the peritoneal cavity.                            Cytology results are pending. 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                            Fine needle biopsy performed. Moderate Sedation:      Not Applicable - Patient had care per Anesthesia. Recommendation:           - The patient will be observed post-procedure,                            until all discharge criteria are met.                           - Discharge patient to home.                           - Patient has a contact number available for                            emergencies. The signs and symptoms of  potential                            delayed complications were discussed with the                            patient. Return to normal activities tomorrow.                            Written discharge instructions were provided to the                            patient.                           - Advance diet as tolerated.                           - Observe patient's clinical course.                           - Continue present medications.                           - The findings and recommendations were discussed  with the patient.                           - The findings and recommendations were discussed                            with the patient's family. Procedure Code(s):        --- Professional ---                           [phone], Esophagogastroduodenoscopy, flexible,                            transoral; with transendoscopic ultrasound-guided                            intramural or transmural fine needle                            aspiration/biopsy(s), (includes endoscopic                            ultrasound examination limited to the esophagus,                            stomach or duodenum, and adjacent structures) Diagnosis Code(s):        --- Professional ---                           K22.89, Other specified disease of esophagus                           K44.9, Diaphragmatic hernia without obstruction or                            gangrene                           K31.89, Other diseases of stomach and duodenum                           K66.8, Other specified disorders of peritoneum                           R93.3, Abnormal findings on diagnostic imaging of                            other parts of digestive tract                           R93.5, Abnormal findings on diagnostic imaging of                            other abdominal regions, including retroperitoneum CPT copyright 2022 American [employer] Association. All rights reserved. The  codes documented in this report are preliminary and upon coder review may  be revised to meet current compliance requirements. Justice Britain, MD  08/23/2022 9:52:09 AM Number of Addenda: 0

## 2022-08-23 NOTE — Discharge Instructions (Signed)
YOU HAD AN ENDOSCOPIC PROCEDURE TODAY: Refer to the procedure report and other information in the discharge instructions given to you for any specific questions about what was found during the examination. If this information does not answer your questions, please call [place] office at [phone] to clarify.  ° °YOU SHOULD EXPECT: Some feelings of bloating in the abdomen. Passage of more gas than usual. Walking can help get rid of the air that was put into your GI tract during the procedure and reduce the bloating. If you had a lower endoscopy (such as a colonoscopy or flexible sigmoidoscopy) you may notice spotting of blood in your stool or on the toilet paper. Some abdominal soreness may be present for a day or two, also. ° °DIET: Your first meal following the procedure should be a light meal and then it is ok to progress to your normal diet. A half-sandwich or bowl of soup is an example of a good first meal. Heavy or fried foods are harder to digest and may make you feel nauseous or bloated. Drink plenty of fluids but you should avoid alcoholic beverages for 24 hours. If you had a esophageal dilation, please see attached instructions for diet.   ° °ACTIVITY: Your care partner should take you home directly after the procedure. You should plan to take it easy, moving slowly for the rest of the day. You can resume normal activity the day after the procedure however YOU SHOULD NOT DRIVE, use power tools, machinery or perform tasks that involve climbing or major physical exertion for 24 hours (because of the sedation medicines used during the test).  ° °SYMPTOMS TO REPORT IMMEDIATELY: °A gastroenterologist can be reached at any hour. Please call [phone]  for any of the following symptoms:  °Following lower endoscopy (colonoscopy, flexible sigmoidoscopy) °Excessive amounts of blood in the stool  °Significant tenderness, worsening of abdominal pains  °Swelling of the abdomen that is new, acute  °Fever of 100° or  higher  °Following upper endoscopy (EGD, EUS, ERCP, esophageal dilation) °Vomiting of blood or coffee ground material  °New, significant abdominal pain  °New, significant chest pain or pain under the shoulder blades  °Painful or persistently difficult swallowing  °New shortness of breath  °Black, tarry-looking or red, bloody stools ° °FOLLOW UP:  °If any biopsies were taken you will be contacted by phone or by letter within the next 1-3 weeks. Call [phone]  if you have not heard about the biopsies in 3 weeks.  °Please also call with any specific questions about appointments or follow up tests. ° °

## 2022-08-23 NOTE — Anesthesia Procedure Notes (Signed)
Procedure Name: MAC Date/Time: 08/23/2022 8:48 AM  Performed by: West Pugh, CRNAPre-anesthesia Checklist: Patient identified, Emergency Drugs available, Suction available, Patient being monitored and Timeout performed Patient Re-evaluated:Patient Re-evaluated prior to induction Oxygen Delivery Method: Simple face mask Preoxygenation: Pre-oxygenation with 100% oxygen Placement Confirmation: positive ETCO2 Dental Injury: Teeth and Oropharynx as per pre-operative assessment

## 2022-08-23 NOTE — Transfer of Care (Signed)
Immediate Anesthesia Transfer of Care Note  Patient: Valerie Webb  Procedure(s) Performed: UPPER ENDOSCOPIC ULTRASOUND (EUS) RADIAL FINE NEEDLE ASPIRATION (FNA) LINEAR  Patient Location: PACU and Endoscopy Unit  Anesthesia Type:MAC  Level of Consciousness: drowsy and patient cooperative  Airway & Oxygen Therapy: Patient Spontanous Breathing and Patient connected to face mask oxygen  Post-op Assessment: Report given to RN and Post -op Vital signs reviewed and stable  Post vital signs: Reviewed and stable  Last Vitals:  Vitals Value Taken Time  BP    Temp    Pulse 78 08/23/22 0948  Resp 16 08/23/22 0949  SpO2 97 % 08/23/22 0948  Vitals shown include unvalidated device data.  Last Pain:  Vitals:   08/23/22 0728  TempSrc: Temporal  PainSc: 0-No pain         Complications: No notable events documented.

## 2022-08-23 NOTE — Anesthesia Preprocedure Evaluation (Addendum)
Anesthesia Evaluation  Patient identified by MRN, date of birth, ID band Patient awake    Reviewed: Allergy & Precautions, NPO status , Patient's Chart, lab work & pertinent test results  History of Anesthesia Complications Negative for: history of anesthetic complications  Airway Mallampati: II  TM Distance: >3 FB Neck ROM: Full    Dental  (+) Dental Advisory Given, Teeth Intact   Pulmonary neg pulmonary ROS   Pulmonary exam normal        Cardiovascular negative cardio ROS Normal cardiovascular exam     Neuro/Psych  Headaches  negative psych ROS   GI/Hepatic Neg liver ROS,,, Gastric mass    Endo/Other  negative endocrine ROS    Renal/GU negative Renal ROS     Musculoskeletal negative musculoskeletal ROS (+)    Abdominal   Peds  Hematology negative hematology ROS (+)   Anesthesia Other Findings   Reproductive/Obstetrics  Endometriosis                              Anesthesia Physical Anesthesia Plan  ASA: 2  Anesthesia Plan: MAC   Post-op Pain Management: Minimal or no pain anticipated   Induction:   PONV Risk Score and Plan: 2 and Propofol infusion and Treatment may vary due to age or medical condition  Airway Management Planned: Nasal Cannula and Natural Airway  Additional Equipment: None  Intra-op Plan:   Post-operative Plan:   Informed Consent: I have reviewed the patients History and Physical, chart, labs and discussed the procedure including the risks, benefits and alternatives for the proposed anesthesia with the patient or authorized representative who has indicated his/her understanding and acceptance.       Plan Discussed with: CRNA and Anesthesiologist  Anesthesia Plan Comments:        Anesthesia Quick Evaluation

## 2022-08-23 NOTE — H&P (Signed)
GASTROENTEROLOGY PROCEDURE H&P NOTE   Primary Care Physician: Mack Hook, MD  HPI: Valerie Webb is a 61 y.o. 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  Past Medical History:  Diagnosis Date   Congenital redundant colon 2008   Endometriosis 2001   Found on exploratory laparoscopic surgery.   Headache    migraines   Heart murmur 1993   Normal Echocardiogram 2021   Past Surgical History:  Procedure Laterality Date   ABLATION ON ENDOMETRIOSIS  2008   BIOPSY  06/28/2022   Procedure: BIOPSY;  Surgeon: Irving Copas., MD;  Location: [facility];  Service: Gastroenterology;;   BREAST EXCISIONAL BIOPSY Right    years ago benign   COLON SURGERY  2008   Removal of redundant colon found on surgery for removal of endometriosis surgery   ESOPHAGOGASTRODUODENOSCOPY (EGD) WITH PROPOFOL N/A 06/28/2022   Procedure: ESOPHAGOGASTRODUODENOSCOPY (EGD) WITH PROPOFOL;  Surgeon: Irving Copas., MD;  Location: [facility];  Service: Gastroenterology;  Laterality: N/A;   EUS N/A 06/28/2022   Procedure: UPPER ENDOSCOPIC ULTRASOUND (EUS) RADIAL;  Surgeon: Irving Copas., MD;  Location: [facility];  Service: Gastroenterology;  Laterality: N/A;   Exploratory Laparoscopic surgery  2001   Pelvic and abdominal pain   FINE NEEDLE ASPIRATION  06/28/2022   Procedure: FINE NEEDLE ASPIRATION (FNA) LINEAR;  Surgeon: Irving Copas., MD;  Location: [facility];  Service: Gastroenterology;;   Current Facility-Administered Medications  Medication Dose Route Frequency Provider Last Rate Last Admin   0.9 %  sodium chloride infusion    Continuous PRN Mansouraty, Telford Nab., MD 10 mL/hr at 08/23/22 0741 1,000 mL at 08/23/22 0741    Current Facility-Administered Medications:    0.9 %  sodium chloride infusion, , , Continuous PRN, Mansouraty, Telford Nab., MD, Last Rate: 10  mL/hr at 08/23/22 0741, 1,000 mL at 08/23/22 0741 No Known Allergies Family History  Problem Relation Age of Onset   Diabetes Mother    Alcohol abuse Father    Throat cancer Father        Throat cancer:  cause of death   Drug abuse Father    Non-Hodgkin's lymphoma Sister 27       Remission   Ovarian cancer Maternal Grandmother    Breast cancer Neg Hx    Stomach cancer Neg Hx    Esophageal cancer Neg Hx    Pancreatic cancer Neg Hx    Colon cancer Neg Hx    Social History   Socioeconomic History   Marital status: Single    Spouse name: Not on file   Number of children: 0   Years of education: Not on file   Highest education level: Bachelor's degree (e.g., BA, AB, BS)  Occupational History   Occupation: Corporate treasurer    Comment: unemployed.  Tobacco Use   Smoking status: Never   Smokeless tobacco: Never  Vaping Use   Vaping Use: Never used  Substance and Sexual Activity   Alcohol use: Not Currently   Drug use: Never   Sexual activity: Not Currently  Other Topics Concern   Not on file  Social History Narrative   Lives at home with her mom and youngest sister.  Other sister is living in [place].   Social Determinants of Health   Financial Resource Strain: Low Risk  (04/18/2021)   Overall Financial Resource Strain (CARDIA)    Difficulty of Paying Living Expenses: Not hard at  all  Food Insecurity: No Food Insecurity (06/13/2021)   Hunger Vital Sign    Worried About Running Out of Food in the Last Year: Never true    Ran Out of Food in the Last Year: Never true  Transportation Needs: No Transportation Needs (06/13/2021)   PRAPARE - Hydrologist (Medical): No    Lack of Transportation (Non-Medical): No  Physical Activity: Not on file  Stress: No Stress Concern Present (05/24/2020)   [place]    Feeling of Stress : Only a little  Social Connections: Socially Isolated (05/27/2020)    Social Connection and Isolation Panel [NHANES]    Frequency of Communication with Friends and Family: Once a week    Frequency of Social Gatherings with Friends and Family: Once a week    Attends Religious Services: 1 to 4 times per year    Active Member of [employer] or Organizations: No    Attends Archivist Meetings: Never    Marital Status: Never married  Intimate Partner Violence: Not At Risk (05/24/2020)   Humiliation, Afraid, Rape, and Kick questionnaire    Fear of Current or Ex-Partner: No    Emotionally Abused: No    Physically Abused: No    Sexually Abused: No    Physical Exam: Today's Vitals   08/14/22 1446 08/23/22 0728  BP:  (!) 157/88  Pulse:  73  Resp:  15  Temp:  (!) 97.5 F (36.4 C)  TempSrc:  Temporal  SpO2:  98%  Weight: 74.8 kg 74.8 kg  Height:  '5\' 11"'$  (1.803 m)  PainSc:  0-No pain   Body mass index is 23.01 kg/m. GEN: NAD EYE: Sclerae anicteric ENT: MMM CV: Non-tachycardic GI: Soft, NT/ND NEURO:  Alert & Oriented x 3  Lab Results: No results for input(s): "WBC", "HGB", "HCT", "PLT" in the last 72 hours. BMET No results for input(s): "NA", "K", "CL", "CO2", "GLUCOSE", "BUN", "CREATININE", "CALCIUM" in the last 72 hours. LFT No results for input(s): "PROT", "ALBUMIN", "AST", "ALT", "ALKPHOS", "BILITOT", "BILIDIR", "IBILI" in the last 72 hours. PT/INR No results for input(s): "LABPROT", "INR" in the last 72 hours.   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  The risks and benefits of endoscopic evaluation/treatment were discussed with the patient and/or family; these include but are not limited to the risk of perforation, infection, bleeding, missed lesions, lack of diagnosis, severe illness requiring hospitalization, as well as anesthesia and sedation related illnesses.  The patient's history has been reviewed,  patient examined, no change in status, and deemed stable for procedure.  The patient and/or family is agreeable to proceed.    Justice Britain, MD [facility] Gastroenterology Advanced Endoscopy Office # [phone]

## 2022-08-24 ENCOUNTER — Encounter (HOSPITAL_COMMUNITY): Payer: Self-pay | Admitting: Gastroenterology

## 2022-08-27 LAB — CYTOLOGY - NON PAP

## 2022-08-30 ENCOUNTER — Encounter: Payer: Self-pay | Admitting: Gastroenterology

## 2022-08-30 ENCOUNTER — Other Ambulatory Visit: Payer: Self-pay

## 2022-08-30 DIAGNOSIS — K3189 Other diseases of stomach and duodenum: Secondary | ICD-10-CM

## 2022-09-12 ENCOUNTER — Ambulatory Visit (HOSPITAL_COMMUNITY)
Admission: RE | Admit: 2022-09-12 | Discharge: 2022-09-12 | Disposition: A | Discharge status: Home / Self Care | Payer: Medicaid Other | Source: Ambulatory Visit | Attending: Gastroenterology | Admitting: Gastroenterology

## 2022-09-12 DIAGNOSIS — K3189 Other diseases of stomach and duodenum: Secondary | ICD-10-CM | POA: Insufficient documentation

## 2022-09-12 MED ORDER — IOHEXOL 300 MG/ML  SOLN
75.0000 mL | Freq: Once | INTRAMUSCULAR | Status: AC | PRN
  Administered 2022-09-12: 75 mL via INTRAVENOUS

## 2022-09-25 ENCOUNTER — Other Ambulatory Visit: Payer: Self-pay

## 2022-09-25 DIAGNOSIS — K3189 Other diseases of stomach and duodenum: Secondary | ICD-10-CM

## 2022-09-26 ENCOUNTER — Telehealth: Payer: Self-pay

## 2022-09-26 NOTE — Telephone Encounter (Signed)
Referral was refaxed to [facility] on 09/25/22 by Chong Sicilian, RN. Refer to CT imaging note 09/12/22.

## 2022-09-26 NOTE — Progress Notes (Signed)
I left message requesting a return call.

## 2022-09-26 NOTE — Telephone Encounter (Signed)
-----   Message from Thornton Park, MD sent at 09/20/2022  9:21 PM EST ----- Please resend referral to surgery. Thanks.  KLB ----- Message ----- From: Timothy Lasso, RN Sent: 08/30/2022   1:30 PM EST To: Mack Hook, MD; Thornton Park, MD

## 2022-09-26 NOTE — Progress Notes (Addendum)
Valerie Webb returned my call.  She is agreeable to new patient consultation on 09/27/2022 at 1100 arrive at 1045.  She is aware of our location.  She has my contact information.  All questions were answered.  She verbalized understanding. 1/4 appt cancelled and rescheduled for 1/5 at 1330 with Cassie Heilengoetter, PA-C. Pt is aware.

## 2022-09-27 ENCOUNTER — Inpatient Hospital Stay: Payer: Self-pay | Admitting: Nurse Practitioner

## 2022-09-27 DIAGNOSIS — C49A0 Gastrointestinal stromal tumor, unspecified site: Secondary | ICD-10-CM | POA: Insufficient documentation

## 2022-09-27 NOTE — Progress Notes (Signed)
[place] Telephone:([phone]) [phone]   Fax:(336) [phone]  CONSULT NOTE  REFERRING PHYSICIAN: Dr. Rush Landmark  REASON FOR CONSULTATION:  GIST   HPI Valerie Webb is a 62 y.o. 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   The patient has a longstanding history of left-sided abdominal pain dating back to 1995. [place] She had had exploratory surgery to evaluate her pain. In 2008 in [place], she had colon reconstruction surgery for a congential redundant colon.   Therefore, it is challenging for her to determine if/when she had worsening symptoms. She reports the pain is unpredictable. She can go several months without feeling the pain. Gradually over the last several years, the pain is becoming more frequent. While hard to estimate due to the pain being unpredictable, she would estimate that it occurs approximately 1x per week.  She reports at its worst, the pain can be a 10 out of 10 which she describes as a "deep" or dull ache. She not able to correlate her onset of symptoms with any particular provoking factor.  Her pain may last a few minutes to several hours to several days.  She has tried tylenol and pain medication in the past without any relief.  She reports sometimes lying on the floor after pain.  The pain eventually resolves spontaneously.   She saw her PCP, Dr. Amil Amen, for the abdominal pain. A CT scan was ordered in 2022 but was not performed 11/23/2021 through Valerie Webb when showed: hepatic lesions most of which are likely cysts; however; the largest in the right lobe is nonspecific and a higher density than fluid in the differential included hemangioma or other solid lesion.  There is also an exophytic mass (4.5 cm) extending posteriorly from the gastric fundus  which could represent benign or malignant neoplasm.  There was also several tiny lung nodules measuring up to 4 mm.   The patient subsequently had an MRI performed on 04/12/2022 further characterize this which showed the gastric mass concerning for malignancy such as GIST tumor and benign right hepatic hemangioma and multiple benign-appearing hepatic cysts.  She established care with Dr. Tarri Glenn from GI on 04/25/2022. She had a EUS on 06/28/2022 which showed mass measuring 35 mm x 24 mm in the peritoneal cavity which did not appear to have any attachment to the stomach itself.  No malignant appearing lymph nodes were visualized in the celiac region or peripancreatic region or porta hepatis region.  A FNA was performed which was had insufficient material for further evaluation.  Therefore, the patient had a repeat EUS on 08/23/2022 demonstrating a mass measuring 29 mm by 22 mm in the peritoneal cavity. The endosonographic appearance is suspicious for an adventitial stromal cell (smooth muscle) neoplasm. The pathology showed spindle cell proliferation. The IHC showed GIST tumor. The Ki67 showed low proliferation rate which suggests low grade.   She had a CT scan of the chest on 09/12/2022 to complete the staging workup which showed gastric mass consistent with the patient's known history of GIST, scattered lung nodules up to 5 mm in which 93-monthfollow-up was recommended. The patient denies recent URI or smoking history.   [facility] She does not have any appointments scheduled at this time.   Besides the abdominal pain discussed above, she states she is  feeling "fine" today.  She denies any fever, chills, night sweats, or unexplained weight loss.  Denies any flushing, chest pain, shortness of breath, cough, hemoptysis, appetite change, or unusual diarrhea/constipation.  Denies any nausea, jaundice, or itching.  Her family medical history, she has a sister who was  diagnosed with lymphoma in her 59s. The patient's maternal grandmother was diagnosed with ovarian cancer later in life.  Her mother has a history of hypertension, gastric bypass surgery, diabetes, and arthritis.  The patient is currently unemployed secondary to her chronic pain.  She has not worked in several years.  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  She lives with her mother and sister.  She does not have any insurance.  She was approved for financial assistance through [facility].  She is single does not have any children.  Denies any smoking, alcohol, or drug use.   HPI  Past Medical History:  Diagnosis Date   Congenital redundant colon 2008   Endometriosis 2001   Found on exploratory laparoscopic surgery.   Headache    migraines   Heart murmur 1993   Normal Echocardiogram 2021    Past Surgical History:  Procedure Laterality Date   ABLATION ON ENDOMETRIOSIS  2008   BIOPSY  06/28/2022   Procedure: BIOPSY;  Surgeon: Irving Copas., MD;  Location: [facility];  Service: Gastroenterology;;   BREAST EXCISIONAL BIOPSY Right    years ago benign   COLON SURGERY  2008   Removal of redundant colon found on surgery for removal of endometriosis surgery   ESOPHAGOGASTRODUODENOSCOPY (EGD) WITH PROPOFOL N/A 06/28/2022   Procedure: ESOPHAGOGASTRODUODENOSCOPY (EGD) WITH PROPOFOL;  Surgeon: Irving Copas., MD;  Location: [facility];  Service: Gastroenterology;  Laterality: N/A;   ESOPHAGOGASTRODUODENOSCOPY (EGD) WITH PROPOFOL N/A 08/23/2022   Procedure: ESOPHAGOGASTRODUODENOSCOPY (EGD) WITH PROPOFOL;  Surgeon: Rush Landmark Telford Nab., MD;  Location: [facility];  Service: Gastroenterology;  Laterality: N/A;   EUS N/A 06/28/2022   Procedure: UPPER ENDOSCOPIC ULTRASOUND (EUS) RADIAL;  Surgeon: Irving Copas., MD;  Location: [facility];  Service: Gastroenterology;  Laterality: N/A;   EUS N/A 08/23/2022   Procedure: UPPER ENDOSCOPIC ULTRASOUND (EUS) RADIAL;  Surgeon:  Irving Copas., MD;  Location: [facility];  Service: Gastroenterology;  Laterality: N/A;   Exploratory Laparoscopic surgery  2001   Pelvic and abdominal pain   FINE NEEDLE ASPIRATION  06/28/2022   Procedure: FINE NEEDLE ASPIRATION (FNA) LINEAR;  Surgeon: Irving Copas., MD;  Location: [facility];  Service: Gastroenterology;;   FINE NEEDLE ASPIRATION N/A 08/23/2022   Procedure: FINE NEEDLE ASPIRATION (FNA) LINEAR;  Surgeon: Irving Copas., MD;  Location: [facility];  Service: Gastroenterology;  Laterality: N/A;    Family History  Problem Relation Age of Onset   Diabetes Mother    Alcohol abuse Father    Throat cancer Father        Throat cancer:  cause of death   Drug abuse Father    Non-Hodgkin's lymphoma Sister 55       Remission   Ovarian cancer Maternal Grandmother    Breast cancer Neg Hx    Stomach cancer Neg Hx    Esophageal cancer Neg Hx    Pancreatic cancer Neg Hx    Colon cancer Neg Hx     Social History Social History   Tobacco Use   Smoking status: Never   Smokeless tobacco: Never  Vaping Use   Vaping Use: Never used  Substance Use Topics   Alcohol use: Not Currently  Drug use: Never    No Known Allergies  Current Outpatient Medications  Medication Sig Dispense Refill   Aspirin-Acetaminophen-Caffeine (EXCEDRIN EXTRA STRENGTH PO) Take 2 tablets by mouth daily as needed (Tension headache).     pantoprazole (PROTONIX) 40 MG tablet Take 1 tablet (40 mg total) by mouth 2 (two) times daily before a meal. 60 tablet 12   No current facility-administered medications for this visit.    REVIEW OF SYSTEMS:   Review of Systems  Constitutional: Negative for appetite change, chills, fatigue, fever and unexpected weight change.  HENT: Negative for mouth sores, nosebleeds, sore throat and trouble swallowing.   Eyes: Negative for eye problems and icterus.  Respiratory: Negative for cough, hemoptysis, shortness of breath and wheezing.    Cardiovascular: Negative for chest pain and leg swelling.  Gastrointestinal: Positive for intermittent left-sided abdominal pain.  Negative for constipation, diarrhea, nausea and vomiting.  Genitourinary: Negative for bladder incontinence, difficulty urinating, dysuria, frequency and hematuria.   Musculoskeletal: Negative for back pain, gait problem, neck pain and neck stiffness.  Skin: Negative for itching and rash.  Neurological: Negative for dizziness, extremity weakness, gait problem, headaches, light-headedness and seizures.  Hematological: Negative for adenopathy. Does not bruise/bleed easily.  Psychiatric/Behavioral: Negative for confusion, depression and sleep disturbance. The patient is not nervous/anxious.     PHYSICAL EXAMINATION:  Blood pressure (!) 166/76, pulse 78, temperature 97.7 F (36.5 C), temperature source Temporal, resp. rate 17, height _0  (1.803 m), weight 167 lb 14.4 oz (76.2 kg), SpO2 100 %.  ECOG PERFORMANCE STATUS: 1  Physical Exam  Constitutional: Oriented to person, place, and time and well-developed, well-nourished, and in no distress.  HENT:  Head: Normocephalic and atraumatic.  Mouth/Throat: Oropharynx is clear and moist. No oropharyngeal exudate.  Eyes: Conjunctivae are normal. Right eye exhibits no discharge. Left eye exhibits no discharge. No scleral icterus.  Neck: Normal range of motion. Neck supple.  Cardiovascular: Normal rate, regular rhythm, normal heart sounds and intact distal pulses.   Pulmonary/Chest: Effort normal and breath sounds normal. No respiratory distress. No wheezes. No rales.  Abdominal: Soft. Bowel sounds are normal. Exhibits no distension and no mass. There is no tenderness.  Musculoskeletal: Normal range of motion. Exhibits no edema.  Lymphadenopathy:    No cervical adenopathy.  Neurological: Alert and oriented to person, place, and time. Exhibits normal muscle tone. Gait normal. Coordination normal.  Skin: Skin is warm  and dry. No rash noted. Not diaphoretic. No erythema. No pallor.  Psychiatric: Mood, memory and judgment normal.  Vitals reviewed.  LABORATORY DATA: Lab Results  Component Value Date   WBC 5.5 04/25/2022   HGB 13.8 04/25/2022   HCT 42.6 04/25/2022   MCV 82.6 04/25/2022   PLT 263.0 04/25/2022      Chemistry      Component Value Date/Time   NA 139 04/25/2022 1104   NA 145 (H) 04/18/2021 1148   K 4.3 04/25/2022 1104   CL 101 04/25/2022 1104   CO2 28 04/25/2022 1104   BUN 11 04/25/2022 1104   BUN 11 04/18/2021 1148   CREATININE 0.85 04/25/2022 1104      Component Value Date/Time   CALCIUM 10.5 04/25/2022 1104   ALKPHOS 105 04/25/2022 1104   AST 17 04/25/2022 1104   ALT 13 04/25/2022 1104   BILITOT 0.5 04/25/2022 1104   BILITOT <0.2 04/18/2021 1148       RADIOGRAPHIC STUDIES: CT CHEST W CONTRAST  Result Date: 09/14/2022 CLINICAL DATA:  Gist EXAM: CT CHEST  WITH CONTRAST TECHNIQUE: Multidetector CT imaging of the chest was performed during intravenous contrast administration. RADIATION DOSE REDUCTION: This exam was performed according to the departmental dose-optimization program which includes automated exposure control, adjustment of the mA and/or kV according to patient size and/or use of iterative reconstruction technique. CONTRAST:  75 mL OMNIPAQUE IOHEXOL 300 MG/ML  SOLN COMPARISON:  None Available. FINDINGS: Cardiovascular: No significant vascular findings. Normal heart size. No pericardial effusion. Mediastinum/Nodes: No enlarged mediastinal, hilar, or axillary lymph nodes. Thyroid gland, trachea, and esophagus demonstrate no significant findings. Lungs/Pleura: No pneumonia or pulmonary edema. There are a few scattered pleural-based nodules on the left measuring up to 3 mm. On the right there is a 5 mm nodule in the middle lobe on image 85. short interval follow-up examination is recommended as a precaution. Consider repeat CT in 3 months. Upper Abdomen: 1.3 cm cyst right  lobe of the liver. Gastric wall mass measuring 4.5 cm consistent with the patient's history. Musculoskeletal: No chest wall abnormality. No acute or significant osseous findings. IMPRESSION: 1. Gastric Mass consistent with the patient's history of GIST. 2. Scattered lung nodules up to 5 mm. Three-month CT follow-up recommended as a precaution. 3. Otherwise no acute cardiopulmonary process identified. Electronically Signed   By: Sammie Bench M.D.   On: 09/14/2022 22:45    ASSESSMENT: This is a very pleasant 62 year old female diagnosed with:  GIST tumor, diagnosed in November 2023 -Patient had ongoing abdominal pain since 1995 of unclear etiology -Had a CT scan (11/23/21) and MRI (04/12/22) performed at [facility] for the chief complaint of abdominal pain revealing: exophytic gastric mass, Benign right hepatic hemangioma/hepatic cysts, and several tiny left lower lobe lung nodules measuring up to 4 mm.  -EUS performed by Dr. Rush Landmark on 08/23/22 found mass in the perigastric peritoneal space adjacent to the fundus measuring 2.9 cm x 2.2 cm. Intact interface between mass and adjacent structures suggesting lack of invasin and adventitial origin.  -CT chest (09/12/22) scattered lung nodules up to 5 mm, 3 month follow up recommended as precaution.  -The patient was referred to [place] surgery.  Does not have upcoming appointment yet. -The final pathology ([id number]) showed spindle cell proliferation. IHC shows positive for CD117 and CD34 consistent with gastrointestinal stromal tumor (GIST).  Ki-67 shows low perforation rate -The patient was seen with Dr. Burr Medico today.  Dr. Burr Medico had a lengthy discussion with the patient today about her current condition and recommended treatment options. -Given the small size, low perforation rate, no evidence of metastatic disease, no neoadjuvant or adjuvant treatment is required. -Dr. Burr Medico recommends that the patient be evaluated by surgery for surgical  resection. -The patient likely will not require any adjuvant or neoadjuvant treatment unless there are concerning findings on the final pathology/specimen report after surgery.  -The patient does not necessarily need to follow with our clinic unless concerns following surgery or concerns in the future requiring systemic treatment.  Will not order KIT mutation testing at this time -The patient may follow-up with surgery and PCP.  2.  Abdominal pain  -The patient has had ongoing abdominal pain since 1995 -She has had multiple evaluations and exploratory surgery to try to find an etiology of this pain.  At some point she was evaluated for possible kidney stones, ovarian cyst, endometriosis, and had surgical resection in 2008 for a redundant congenital colon. -For the last several years, she estimates her pain occurs approximately once a week, but overall it is random in onset with no  provoking factors.  She has tried multiple modalities for pain control including pain medication, without any relief -Dr. Burr Medico thinks it is unlikely that the pain is related to the GIST tumor since this is a small lesion and unlikely to have been present since 1995  3. Lung nodules -CT chest (09/12/22) scattered lung nodules up to 5 mm, 3 month follow up recommended as precaution.  -Reviewed with the patient that in the future after surgery, this will need to be followed up on by her following providers  4.  Lack of income/insurance -The patient does not work secondary to her chronic pain. -Socially she lives with her sister and her mother. -The patient has been approved for financial assistance through [employer].    PLAN: -Follow-up with surgery -No follow-up needed at this time unless concerning findings on final surgical specimen -Will need repeat CT scan of the chest in the future to follow-up on small lung nodules.  The patient voices understanding of current disease status and treatment options and is in  agreement with the current care plan.  All questions were answered. The patient knows to call the clinic with any problems, questions or concerns. We can certainly see the patient much sooner if necessary.  Thank you so much for allowing me to participate in the care of Valerie Webb. I will continue to follow up the patient with you and assist in her care.  I spent 40 minutes counseling the patient face to face. The total time spent in the appointment was 50 minutes.  Disclaimer: This note was dictated with voice recognition software. Similar sounding words can inadvertently be transcribed and may not be corrected upon review.   Kellan Boehlke L Ozell Juhasz September 28, 2022, 2:47 PM  Addendum I have seen the patient, examined her. I agree with the assessment and and plan and have edited the notes.   62 yo female with chronic upper abdominal pain, presented with incidental finding of a gastric mass on CT scan.  I reviewed her endoscopy and the biopsy results, which confirmed  low grade GIST. Her tumor is about 3cm, so her risk of recurrence after surgical resection is pretty low.  I recommend surgical consultation, we will make sure she has appointment with [facility] Dr. Zenia Resides or Dr. Barry Dienes.  If the surgical biopsy confirmed low-grade GIST, tumor <5cm, she would not need adjuvant Gleevec.  I recommended her to follow-up with her surgeon in the future, I will see her as needed. All questions were answered.   Truitt Merle MD 09/28/2022

## 2022-09-28 ENCOUNTER — Other Ambulatory Visit: Payer: Self-pay

## 2022-09-28 ENCOUNTER — Inpatient Hospital Stay: Payer: Medicaid Other | Attending: Physician Assistant | Admitting: Physician Assistant

## 2022-09-28 VITALS — BP 166/76 | HR 78 | Temp 97.7°F | Resp 17 | Ht 71.0 in | Wt 167.9 lb

## 2022-09-28 DIAGNOSIS — C49A0 Gastrointestinal stromal tumor, unspecified site: Secondary | ICD-10-CM | POA: Insufficient documentation

## 2022-09-28 DIAGNOSIS — Z596 Low income: Secondary | ICD-10-CM | POA: Diagnosis not present

## 2022-09-28 DIAGNOSIS — C49A9 Gastrointestinal stromal tumor of other sites: Secondary | ICD-10-CM

## 2022-09-28 DIAGNOSIS — Z833 Family history of diabetes mellitus: Secondary | ICD-10-CM | POA: Diagnosis not present

## 2022-09-28 DIAGNOSIS — R911 Solitary pulmonary nodule: Secondary | ICD-10-CM | POA: Insufficient documentation

## 2022-09-28 DIAGNOSIS — Z8261 Family history of arthritis: Secondary | ICD-10-CM | POA: Diagnosis not present

## 2022-09-28 DIAGNOSIS — N809 Endometriosis, unspecified: Secondary | ICD-10-CM | POA: Diagnosis not present

## 2022-09-28 DIAGNOSIS — Z8041 Family history of malignant neoplasm of ovary: Secondary | ICD-10-CM | POA: Insufficient documentation

## 2022-09-28 DIAGNOSIS — Z808 Family history of malignant neoplasm of other organs or systems: Secondary | ICD-10-CM | POA: Insufficient documentation

## 2022-09-28 DIAGNOSIS — K319 Disease of stomach and duodenum, unspecified: Secondary | ICD-10-CM | POA: Diagnosis not present

## 2022-09-28 DIAGNOSIS — Z807 Family history of other malignant neoplasms of lymphoid, hematopoietic and related tissues: Secondary | ICD-10-CM | POA: Diagnosis not present

## 2022-09-28 DIAGNOSIS — Q438 Other specified congenital malformations of intestine: Secondary | ICD-10-CM | POA: Diagnosis not present

## 2022-09-28 DIAGNOSIS — Z814 Family history of other substance abuse and dependence: Secondary | ICD-10-CM | POA: Diagnosis not present

## 2022-09-28 DIAGNOSIS — K7689 Other specified diseases of liver: Secondary | ICD-10-CM | POA: Insufficient documentation

## 2022-09-28 DIAGNOSIS — Z8249 Family history of ischemic heart disease and other diseases of the circulatory system: Secondary | ICD-10-CM | POA: Insufficient documentation

## 2022-09-28 DIAGNOSIS — Z811 Family history of alcohol abuse and dependence: Secondary | ICD-10-CM | POA: Insufficient documentation

## 2022-09-28 DIAGNOSIS — R011 Cardiac murmur, unspecified: Secondary | ICD-10-CM | POA: Insufficient documentation

## 2022-09-28 DIAGNOSIS — G8929 Other chronic pain: Secondary | ICD-10-CM | POA: Diagnosis not present

## 2022-09-28 DIAGNOSIS — R1012 Left upper quadrant pain: Secondary | ICD-10-CM | POA: Insufficient documentation

## 2022-09-28 DIAGNOSIS — Z79899 Other long term (current) drug therapy: Secondary | ICD-10-CM | POA: Insufficient documentation

## 2022-09-28 NOTE — Progress Notes (Signed)
I met with Ms Ottey during her consultation with Cassie Heilingoetter, PA-C and Dr Burr Medico.  I explained my role as a nurse navigator and provided my contact information.

## 2022-09-28 NOTE — Patient Instructions (Signed)
It was very nice meeting you today. -I know we covered a lot of very important information.  Here is a summary of the main take away points. -The sample that Dr. Rush Landmark got from your recent procedure shows a type of cancer called a GIST tumor.  I have some information about GIST tumors attached on your after visit summary.  -A pathologist looked at your sample under microscope and did some test.  It looks like your tumor is slow-growing which is good.  Additionally, it is generally more favorable to have a GIST tumor arising from the stomach versus other abdominal organs. -It is hard to say how long this has been present for since you had not had any imaging over the last few years but may have started growing over the few years.  Dr. Burr Medico does not think this is related to your abdominal pain as your abdominal pain has been present since 1995 and it is unlikely that this tumor was present at that time, much less, would be large enough to be causing any symptoms.  Dr. Burr Medico does not think you need treatment to shrink the tumor prior to surgery since it is slow-growing and is relatively small already.  Therefore, Dr. Burr Medico would recommend seeing a surgeon to discuss the logistics of surgery.  The 2 surgeons in town that performed this kind of surgery are Dr. Barry Dienes or Dr. Zenia Resides.  You should hopefully be hearing from their office soon for a consultation. -Because no treatment from a medical oncology standpoint is required, you do not need to follow-up with our office, unless there are concerning findings after surgery with the tumor. For example, a pathologist will look at the whole tumor final to report the final size and to ensure there are no concerning findings for spread and and to make sure that all the tumor was removed. -You will likely follow with surgery for a few appointments.  Please remember that you have some small lung nodules that will need to be followed with repeat CT scan of the chest in the  future.  His nodules are very small and are too small to biopsy or to characterize further on other types of imaging modalities such as a PET scan.  -Of course, if you have any concerns or feel like you need to see [place] back in the future please feel free to reach out to our office.  Our number is [phone].

## 2022-10-03 ENCOUNTER — Other Ambulatory Visit: Payer: Self-pay

## 2022-10-03 NOTE — Progress Notes (Signed)
The proposed treatment discussed in conference is for discussion purpose only and is not a binding recommendation.  The patients have not been physically examined, or presented with their treatment options.  Therefore, final treatment plans cannot be decided.  

## 2022-10-16 ENCOUNTER — Ambulatory Visit: Payer: Self-pay | Admitting: Surgery

## 2022-10-16 ENCOUNTER — Other Ambulatory Visit: Payer: Self-pay | Admitting: Surgery

## 2022-10-16 DIAGNOSIS — Z809 Family history of malignant neoplasm, unspecified: Secondary | ICD-10-CM

## 2022-10-16 NOTE — H&P (Signed)
History of Present Illness: Valerie Webb is a 62 y.o. female who was referred to me for evaluation of a GIST. She had been having intermittent abdominal pain and underwent a CT scan at [facility] in March 2023. This showed a mass posterior to the proximal stomach. She was referred to GI and ultimately had an EUS on 06/28/22, which showed imaging characteristics consistent with a smooth muscle tumor, however FNA was nondiagnostic. She had a repeat EUS with FNA on 11/30, which showed a 3cm mass, path showed spindle cells positive for CD117 and CD34, consistent with a GIST. Ki67 was low. She was referred to discuss surgery. Staging CT chest showed scattered subcentimeter lung nodules, for which surveillance was recommended.   Previous abdominal surgeries include a colon resection in 2008 for redundant colon, which she says was done in [place]. She has also had surgery for endometriosis, she thinks around 2000. She reports her sister had ovarian cancer, and her father also had cancer. She has not had any genetic screening.     Review of Systems: A complete review of systems was obtained from the patient.  I have reviewed this information and discussed as appropriate with the patient.  See HPI as well for other ROS.       Medical History: Past Medical History Past Medical History: Diagnosis Date  Arthritis    GERD (gastroesophageal reflux disease)    History of cancer        There is no problem list on file for this patient.     Past Surgical History Past Surgical History: Procedure Laterality Date  COLON SURGERY   07/2007      Allergies No Known Allergies    No current outpatient medications on file prior to visit.   No current facility-administered medications on file prior to visit.     Family History History reviewed. No pertinent family history.     Social History   Tobacco Use Smoking Status Never Smokeless Tobacco Never     Social History Social History     Socioeconomic History  Marital status: Single Tobacco Use  Smoking status: Never  Smokeless tobacco: Never Substance and Sexual Activity  Alcohol use: Not Currently  Drug use: Never      Objective:     Vitals:   10/16/22 1112 BP: (!) 154/92 Pulse: 82 Temp: 36.8 C (98.3 F) SpO2: 98% Weight: 74.4 kg (164 lb) Height: 180.3 cm ('5\' 11"'$ ) PainSc:   3   Body mass index is 22.87 kg/m.   Physical Exam Vitals reviewed.  Constitutional:      Appearance: Normal appearance.  HENT:     Head: Normocephalic and atraumatic.  Eyes:     General: No scleral icterus.    Conjunctiva/sclera: Conjunctivae normal.  Cardiovascular:     Rate and Rhythm: Normal rate and regular rhythm.     Heart sounds: No murmur heard. Pulmonary:     Effort: Pulmonary effort is normal. No respiratory distress.     Breath sounds: Normal breath sounds. No wheezing.  Abdominal:     General: There is no distension.     Palpations: Abdomen is soft.     Tenderness: There is no abdominal tenderness.     Comments: Well-healed periumbilical surgical scar, no scars in upper abdomen.  Musculoskeletal:        General: Normal range of motion.  Skin:    General: Skin is warm and dry.     Coloration: Skin is not jaundiced.  Neurological:  General: No focal deficit present.     Mental Status: She is alert and oriented to person, place, and time.  Psychiatric:        Mood and Affect: Mood normal.        Behavior: Behavior normal.            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 I have reviewed her referral notes, imaging and labs. This appears resectable via a laparoscopic wedge resection. I reviewed the details of laparoscopic gastric wedge resection, and the benefits and risks, as well as the possibility of conversion to  laparotomy. She expressed understanding and agrees to proceed with surgery. She asked about her small hiatal hernia, which was noted on endoscopy. She does not have any reflux symptoms. I recommended observation unless this becomes symptomatic. Genetics referral placed given multiple first degree relatives with cancer and personal history.  Michaelle Birks, MD [facility] Surgery General, Hepatobiliary and Pancreatic Surgery 10/16/22 11:43 AM

## 2022-11-09 NOTE — Progress Notes (Signed)
Surgical Instructions    Your procedure is scheduled on Friday February 23rd.  Report to [facility] Main Entrance "A" at 6:15 A.M., then check in with the Admitting office.  Call this number if you have problems the morning of surgery:  [phone]   If you have any questions prior to your surgery date call [phone]: Open Monday-Friday 8am-4pm If you experience any cold or flu symptoms such as cough, fever, chills, shortness of breath, etc. between now and your scheduled surgery, please notify us at the above number     Remember:  Do not eat after midnight the night before your surgery  You may drink clear liquids until 5:15am the morning of your surgery.   Clear liquids allowed are: Water, Non-Citrus Juices (without pulp), Carbonated Beverages, Clear Tea, Black Coffee ONLY (NO MILK, CREAM OR POWDERED CREAMER of any kind), and Gatorade    Take these medicines the morning of surgery with A SIP OF WATER:  pantoprazole (PROTONIX) 40 MG tablet    As of today, STOP taking any Aspirin (unless otherwise instructed by your surgeon) Aleve, Naproxen, Ibuprofen, Motrin, Advil, Goody's, BC's, all herbal medications, fish oil, and all vitamins.           Do not wear jewelry or makeup. Do not wear lotions, powders, perfumes or deodorant. Do not shave 48 hours prior to surgery.   Do not bring valuables to the hospital. Do not wear nail polish, gel polish, artificial nails, or any other type of covering on natural nails (fingers and toes) If you have artificial nails or gel coating that need to be removed by a nail salon, please have this removed prior to surgery. Artificial nails or gel coating may interfere with anesthesia's ability to adequately monitor your vital signs.  [facility] [facility] is not responsible for any belongings or valuables.    Do NOT Smoke (Tobacco/Vaping)  24 hours prior to your procedure  If you use a CPAP at night, you may bring your mask for your overnight stay.    Contacts, glasses, hearing aids, dentures or partials may not be worn into surgery, please bring cases for these belongings   For patients admitted to the hospital, discharge time will be determined by your treatment team.   Patients discharged the day of surgery will not be allowed to drive home, and someone needs to stay with them for 24 hours.   SURGICAL WAITING ROOM VISITATION Patients having surgery or a procedure may have no more than 2 support people in the waiting area - these visitors may rotate.   Children under the age of 40 must have an adult with them who is not the patient. If the patient needs to stay at the hospital during part of their recovery, the visitor guidelines for inpatient rooms apply. Pre-op nurse will coordinate an appropriate time for 1 support person to accompany patient in pre-op.  This support person may not rotate.   Please refer to [email or web address] for the visitor guidelines for Inpatients (after your surgery is over and you are in a regular room).    Special instructions:    Oral Hygiene is also important to reduce your risk of infection.  Remember - BRUSH YOUR TEETH THE MORNING OF SURGERY WITH YOUR REGULAR TOOTHPASTE   [facility]- Preparing For Surgery  Before surgery, you can play an important role. Because skin is not sterile, your skin needs to be as free of germs as possible. You can reduce the number of germs on your  skin by washing with CHG (chlorahexidine gluconate) Soap before surgery.  CHG is an antiseptic cleaner which kills germs and bonds with the skin to continue killing germs even after washing.     Please do not use if you have an allergy to CHG or antibacterial soaps. If your skin becomes reddened/irritated stop using the CHG.  Do not shave (including legs and underarms) for at least 48 hours prior to first CHG shower. It is OK to shave your face.  Please follow these instructions  carefully.     Shower the NIGHT BEFORE SURGERY and the MORNING OF SURGERY with CHG Soap.   If you chose to wash your hair, wash your hair first as usual with your normal shampoo. After you shampoo, rinse your hair and body thoroughly to remove the shampoo.  Then [employer] and genitals (private parts) with your normal soap and rinse thoroughly to remove soap.  After that Use CHG Soap as you would any other liquid soap. You can apply CHG directly to the skin and wash gently with a scrungie or a clean washcloth.   Apply the CHG Soap to your body ONLY FROM THE NECK DOWN.  Do not use on open wounds or open sores. Avoid contact with your eyes, ears, mouth and genitals (private parts). Wash Face and genitals (private parts)  with your normal soap.   Wash thoroughly, paying special attention to the area where your surgery will be performed.  Thoroughly rinse your body with warm water from the neck down.  DO NOT shower/wash with your normal soap after using and rinsing off the CHG Soap.  Pat yourself dry with a CLEAN TOWEL.  Wear CLEAN PAJAMAS to bed the night before surgery  Place CLEAN SHEETS on your bed the night before your surgery  DO NOT SLEEP WITH PETS.   Day of Surgery:  Take a shower with CHG soap. Wear Clean/Comfortable clothing the morning of surgery Do not apply any deodorants/lotions.   Remember to brush your teeth WITH YOUR REGULAR TOOTHPASTE.    If you received a COVID test during your pre-op visit, it is requested that you wear a mask when out in public, stay away from anyone that may not be feeling well, and notify your surgeon if you develop symptoms. If you have been in contact with anyone that has tested positive in the last 10 days, please notify your surgeon.    Please read over the following fact sheets that you were given.

## 2022-11-12 ENCOUNTER — Encounter (HOSPITAL_COMMUNITY)
Admission: RE | Admit: 2022-11-12 | Discharge: 2022-11-12 | Disposition: A | Discharge status: Home / Self Care | Payer: Medicaid Other | Source: Ambulatory Visit | Attending: Surgery | Admitting: Surgery

## 2022-11-12 ENCOUNTER — Other Ambulatory Visit: Payer: Self-pay

## 2022-11-12 ENCOUNTER — Encounter (HOSPITAL_COMMUNITY): Payer: Self-pay

## 2022-11-12 VITALS — BP 151/92 | HR 90 | Temp 97.7°F | Resp 18 | Ht 71.0 in | Wt 166.6 lb

## 2022-11-12 DIAGNOSIS — Z01818 Encounter for other preprocedural examination: Secondary | ICD-10-CM | POA: Diagnosis not present

## 2022-11-12 HISTORY — DX: Malignant (primary) neoplasm, unspecified: C80.1

## 2022-11-12 LAB — CBC
HCT: 42 % (ref 36.0–46.0)
Hemoglobin: 13.5 g/dL (ref 12.0–15.0)
MCH: 27.1 pg (ref 26.0–34.0)
MCHC: 32.1 g/dL (ref 30.0–36.0)
MCV: 84.2 fL (ref 80.0–100.0)
Platelets: 284 10*3/uL (ref 150–400)
RBC: 4.99 MIL/uL (ref 3.87–5.11)
RDW: 13.7 % (ref 11.5–15.5)
WBC: 4.2 10*3/uL (ref 4.0–10.5)
nRBC: 0 % (ref 0.0–0.2)

## 2022-11-12 NOTE — Progress Notes (Signed)
PCP - Dr. Mack Hook Cardiologist - denies  PPM/ICD - n/a  Chest x-ray - n/a EKG - 11/12/22; pt reports undiagnosed HTN. Stress Test - denies ECHO - 07/14/20; hx of heart murmur. Normal ECHO Cardiac Cath - denies  Sleep Study - denies CPAP - denies  Blood Thinner Instructions: n/a Aspirin Instructions: n/a  ERAS Protcol -Clear liquids until 0515 DOS. PRE-SURGERY Ensure or G2- none ordered.  COVID TEST- n/a  Anesthesia review: Yes, EKG review.   Patient denies shortness of breath, fever, cough and chest pain at PAT appointment   All instructions explained to the patient, with a verbal understanding of the material. Patient agrees to go over the instructions while at home for a better understanding. Patient also instructed to self quarantine after being tested for COVID-19. The opportunity to ask questions was provided.

## 2022-11-13 NOTE — Progress Notes (Signed)
Anesthesia Chart Review:  Case: [id number] Date/Time: 11/16/22 0800   Procedure: LAPAROSCOPIC GASTRIC WEDGE RESECTION   Anesthesia type: General   Pre-op diagnosis: GASTRIC GIST   Location: [facility] OR ROOM 02 / [place] OR   Surgeons: Dwan Bolt, MD       DISCUSSION: Patient is a 62 year old female scheduled for the above procedure.  History includes never smoker, endometriosis (s/p exploratory lap 2001, ablation 2008), gastric GIST (2023), murmur (trivial MR 07/14/20 echo), redundant colon (s/p surgery 2008 in [place]), chin implant (2005).  On 08/2019 CT, scattered lung nodules up to 5 mm were noted with 3 month follow-up CT recommended. Elevated BP without diagnosis of HTN.   She denies shortness of breath and chest pain at PAT RN visit.  EKG showed normal sinus rhythm, minimal voltage criteria for LVH.  Unremarkable echocardiogram in October 2021.  Anesthesia team to evaluate on the day of surgery.  VS: BP (!) 151/92   Pulse 90   Temp 36.5 C   Resp 18   Ht 5' 11"$  (1.803 m)   Wt 75.6 kg   LMP  (LMP Unknown)   SpO2 100%   BMI 23.24 kg/m    PROVIDERS: Mack Hook, MD is PCP, established since 05/24/20.  Thornton Park, MD is GI - Truitt Merle, MD is HEM-ONC - She was evaluated by cardiologist Rudean Haskell on 06/24/20 for murmur. 07/14/20 echo showed LVEF [id number], grade 1 diastolic function, no LV regional wall motion abnormalities, normal RV systolic function, normal PASP, RVSP 16.1 mmHg, trivial MR.    LABS: Labs reviewed: Acceptable for surgery. On 04/25/22 she had a normal CMET except for mildly elevated total protein of 8.8.  (all labs ordered are listed, but only abnormal results are displayed)  Labs Reviewed  CBC  TYPE AND SCREEN    IMAGES: CT Chest 09/12/22: IMPRESSION: 1. Gastric Mass consistent with the patient's history of GIST. 2. Scattered lung nodules up to 5 mm. Three-month CT follow-up recommended as a precaution. 3. Otherwise no acute  cardiopulmonary process identified.   MRI Abd 04/12/22 ([facility]) IMPRESSION:  1. 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  2.  Benign right hepatic hemangioma. Multiple benign-appearing hepatic cysts. No lesion suspicious for malignancy.  3.  Left renal cortical scarring. Mild left pelviectasis, new since prior exam. The ureter is suboptimally evaluated by MRI, correlate with CT or retrograde pyelogram.    EKG: 11/12/22: Normal sinus rhythm Minimal voltage criteria for LVH, may be normal variant ( Sokolow-Lyon ) Borderline ECG Confirmed by Eleonore Chiquito [phone]) on 11/12/2022 8:15:49 PM   CV: Echo 07/14/20: IMPRESSIONS   1. Left ventricular ejection fraction, by estimation, is 60 to 65%. The  left ventricle has normal function. The left ventricle has no regional  wall motion abnormalities. Left ventricular diastolic parameters are  consistent with Grade I diastolic  dysfunction (impaired relaxation).   2. Right ventricular systolic function is normal. The right ventricular  size is normal. There is normal pulmonary artery systolic pressure. The  estimated right ventricular systolic pressure is [id number] mmHg.   3. The mitral valve is normal in structure. Trivial mitral valve  regurgitation. No evidence of mitral stenosis.   4. The aortic valve is tricuspid. Aortic valve regurgitation is not  visualized. No aortic stenosis is present.   5. The inferior vena cava is normal in size with greater than 50%  respiratory variability, suggesting right atrial pressure of 3 mmHg.    Past Medical History:  Diagnosis Date   Cancer [facility])    stomach cancer   Congenital redundant colon 2008   Endometriosis 2001   Found on exploratory laparoscopic surgery.   Headache    migraines   Heart murmur 1993   Normal Echocardiogram 2021    Past Surgical History:  Procedure Laterality Date   ABLATION ON ENDOMETRIOSIS  2008   BIOPSY  06/28/2022   Procedure: BIOPSY;   Surgeon: Irving Copas., MD;  Location: [facility] ENDOSCOPY;  Service: Gastroenterology;;   BREAST EXCISIONAL BIOPSY Right    years ago benign   chin implant  2005   COLON SURGERY  2008   Removal of redundant colon found on surgery for removal of endometriosis surgery   ESOPHAGOGASTRODUODENOSCOPY (EGD) WITH PROPOFOL N/A 06/28/2022   Procedure: ESOPHAGOGASTRODUODENOSCOPY (EGD) WITH PROPOFOL;  Surgeon: Irving Copas., MD;  Location: [facility];  Service: Gastroenterology;  Laterality: N/A;   ESOPHAGOGASTRODUODENOSCOPY (EGD) WITH PROPOFOL N/A 08/23/2022   Procedure: ESOPHAGOGASTRODUODENOSCOPY (EGD) WITH PROPOFOL;  Surgeon: Rush Landmark Telford Nab., MD;  Location: [facility] ENDOSCOPY;  Service: Gastroenterology;  Laterality: N/A;   EUS N/A 06/28/2022   Procedure: UPPER ENDOSCOPIC ULTRASOUND (EUS) RADIAL;  Surgeon: Irving Copas., MD;  Location: [facility] ENDOSCOPY;  Service: Gastroenterology;  Laterality: N/A;   EUS N/A 08/23/2022   Procedure: UPPER ENDOSCOPIC ULTRASOUND (EUS) RADIAL;  Surgeon: Irving Copas., MD;  Location: [facility] ENDOSCOPY;  Service: Gastroenterology;  Laterality: N/A;   Exploratory Laparoscopic surgery  2001   Pelvic and abdominal pain   FINE NEEDLE ASPIRATION  06/28/2022   Procedure: FINE NEEDLE ASPIRATION (FNA) LINEAR;  Surgeon: Irving Copas., MD;  Location: [facility];  Service: Gastroenterology;;   FINE NEEDLE ASPIRATION N/A 08/23/2022   Procedure: FINE NEEDLE ASPIRATION (FNA) LINEAR;  Surgeon: Irving Copas., MD;  Location: [facility];  Service: Gastroenterology;  Laterality: N/A;    MEDICATIONS:  Aspirin-Acetaminophen-Caffeine (EXCEDRIN EXTRA STRENGTH PO)   No current facility-administered medications for this encounter.    Myra Gianotti, PA-C Surgical Short Stay/Anesthesiology [facility] Phone [phone] [facility] Phone [phone] 11/13/2022 3:46 PM

## 2022-11-13 NOTE — Anesthesia Preprocedure Evaluation (Addendum)
Anesthesia Evaluation  Patient identified by MRN, date of birth, ID band Patient awake    Reviewed: Allergy & Precautions, NPO status , Patient's Chart, lab work & pertinent test results  History of Anesthesia Complications Negative for: history of anesthetic complications  Airway Mallampati: II  TM Distance: >3 FB Neck ROM: Full    Dental no notable dental hx.    Pulmonary neg pulmonary ROS   Pulmonary exam normal        Cardiovascular negative cardio ROS Normal cardiovascular exam     Neuro/Psych  Headaches  negative psych ROS   GI/Hepatic Neg liver ROS,,,Gastric ca   Endo/Other  negative endocrine ROS    Renal/GU negative Renal ROS  negative genitourinary   Musculoskeletal negative musculoskeletal ROS (+)    Abdominal   Peds  Hematology negative hematology ROS (+)   Anesthesia Other Findings Day of surgery medications reviewed with patient.  Reproductive/Obstetrics negative OB ROS                              Anesthesia Physical Anesthesia Plan  ASA: 2  Anesthesia Plan: General   Post-op Pain Management: Tylenol PO (pre-op)*   Induction: Intravenous  PONV Risk Score and Plan: 3 and Treatment may vary due to age or medical condition, Ondansetron, Dexamethasone, Midazolam, Scopolamine patch - Pre-op and Propofol infusion  Airway Management Planned: Oral ETT  Additional Equipment: None  Intra-op Plan:   Post-operative Plan: Extubation in OR  Informed Consent: I have reviewed the patients History and Physical, chart, labs and discussed the procedure including the risks, benefits and alternatives for the proposed anesthesia with the patient or authorized representative who has indicated his/her understanding and acceptance.     Dental advisory given  Plan Discussed with: CRNA  Anesthesia Plan Comments: (PAT note written 11/13/2022 by Myra Gianotti, PA-C.  )         Anesthesia Quick Evaluation

## 2022-11-14 LAB — TYPE AND SCREEN
ABO/RH(D): B NEG
Antibody Screen: NEGATIVE

## 2022-11-16 ENCOUNTER — Inpatient Hospital Stay (HOSPITAL_COMMUNITY): Payer: Medicaid Other | Admitting: Anesthesiology

## 2022-11-16 ENCOUNTER — Other Ambulatory Visit: Payer: Self-pay

## 2022-11-16 ENCOUNTER — Encounter (HOSPITAL_COMMUNITY)
Admission: RE | Disposition: A | Discharge status: Home / Self Care | Payer: Self-pay | Source: Home / Self Care | Attending: Surgery

## 2022-11-16 ENCOUNTER — Inpatient Hospital Stay (HOSPITAL_COMMUNITY): Payer: Medicaid Other | Admitting: Vascular Surgery

## 2022-11-16 ENCOUNTER — Encounter (HOSPITAL_COMMUNITY): Payer: Self-pay | Admitting: Surgery

## 2022-11-16 ENCOUNTER — Inpatient Hospital Stay (HOSPITAL_COMMUNITY)
Admission: RE | Admit: 2022-11-16 | Discharge: 2022-11-18 | DRG: 328 | Disposition: A | Discharge status: Home / Self Care | Payer: Medicaid Other | Attending: Surgery | Admitting: Surgery

## 2022-11-16 DIAGNOSIS — Z9049 Acquired absence of other specified parts of digestive tract: Secondary | ICD-10-CM | POA: Diagnosis not present

## 2022-11-16 DIAGNOSIS — Z808 Family history of malignant neoplasm of other organs or systems: Secondary | ICD-10-CM

## 2022-11-16 DIAGNOSIS — Z811 Family history of alcohol abuse and dependence: Secondary | ICD-10-CM | POA: Diagnosis not present

## 2022-11-16 DIAGNOSIS — C49A2 Gastrointestinal stromal tumor of stomach: Secondary | ICD-10-CM

## 2022-11-16 DIAGNOSIS — Z8041 Family history of malignant neoplasm of ovary: Secondary | ICD-10-CM

## 2022-11-16 DIAGNOSIS — Z833 Family history of diabetes mellitus: Secondary | ICD-10-CM | POA: Diagnosis not present

## 2022-11-16 DIAGNOSIS — Z807 Family history of other malignant neoplasms of lymphoid, hematopoietic and related tissues: Secondary | ICD-10-CM | POA: Diagnosis not present

## 2022-11-16 DIAGNOSIS — Z813 Family history of other psychoactive substance abuse and dependence: Secondary | ICD-10-CM | POA: Diagnosis not present

## 2022-11-16 HISTORY — PX: LAPAROSCOPIC PARTIAL GASTRECTOMY: SHX5908

## 2022-11-16 LAB — ABO/RH: ABO/RH(D): B NEG

## 2022-11-16 SURGERY — LAPAROSCOPIC PARTIAL GASTRECTOMY
Anesthesia: General

## 2022-11-16 MED ORDER — DIPHENHYDRAMINE HCL 25 MG PO CAPS: 25.0000 mg | ORAL_CAPSULE | Freq: Four times a day (QID) | ORAL | Status: DC | PRN

## 2022-11-16 MED ORDER — HYDROMORPHONE HCL 1 MG/ML IJ SOLN
INTRAMUSCULAR | Status: AC
  Filled 2022-11-16: qty 1

## 2022-11-16 MED ORDER — ONDANSETRON HCL 4 MG/2ML IJ SOLN
INTRAMUSCULAR | Status: AC
  Filled 2022-11-16: qty 2

## 2022-11-16 MED ORDER — LIDOCAINE 2% (20 MG/ML) 5 ML SYRINGE
INTRAMUSCULAR | Status: AC
  Filled 2022-11-16: qty 5

## 2022-11-16 MED ORDER — ONDANSETRON 4 MG PO TBDP: 4.0000 mg | ORAL_TABLET | Freq: Four times a day (QID) | ORAL | 0 refills | Status: AC | PRN

## 2022-11-16 MED ORDER — ACETAMINOPHEN 500 MG PO TABS
1000.0000 mg | ORAL_TABLET | Freq: Once | ORAL | Status: AC
  Administered 2022-11-16: 1000 mg via ORAL
  Filled 2022-11-16: qty 2

## 2022-11-16 MED ORDER — SUGAMMADEX SODIUM 200 MG/2ML IV SOLN
INTRAVENOUS | Status: DC | PRN
  Administered 2022-11-16 (×3): 100 mg via INTRAVENOUS

## 2022-11-16 MED ORDER — FENTANYL CITRATE (PF) 250 MCG/5ML IJ SOLN
INTRAMUSCULAR | Status: DC | PRN
  Administered 2022-11-16 (×2): 50 ug via INTRAVENOUS
  Administered 2022-11-16: 100 ug via INTRAVENOUS

## 2022-11-16 MED ORDER — FENTANYL CITRATE (PF) 250 MCG/5ML IJ SOLN
INTRAMUSCULAR | Status: AC
  Filled 2022-11-16: qty 5

## 2022-11-16 MED ORDER — LACTATED RINGERS IV SOLN: INTRAVENOUS | Status: DC | PRN

## 2022-11-16 MED ORDER — MELATONIN 3 MG PO TABS
3.0000 mg | ORAL_TABLET | Freq: Every evening | ORAL | Status: DC | PRN
  Administered 2022-11-16 – 2022-11-17 (×2): 3 mg via ORAL
  Filled 2022-11-16 (×2): qty 1

## 2022-11-16 MED ORDER — MIDAZOLAM HCL 2 MG/2ML IJ SOLN
INTRAMUSCULAR | Status: DC | PRN
  Administered 2022-11-16 (×2): 1 mg via INTRAVENOUS

## 2022-11-16 MED ORDER — HYDROMORPHONE HCL 1 MG/ML IJ SOLN
INTRAMUSCULAR | Status: AC
  Filled 2022-11-16: qty 2

## 2022-11-16 MED ORDER — DIPHENHYDRAMINE HCL 50 MG/ML IJ SOLN: 25.0000 mg | Freq: Four times a day (QID) | INTRAMUSCULAR | Status: DC | PRN

## 2022-11-16 MED ORDER — BUPIVACAINE HCL (PF) 0.25 % IJ SOLN
INTRAMUSCULAR | Status: AC
  Filled 2022-11-16: qty 30

## 2022-11-16 MED ORDER — DOCUSATE SODIUM 100 MG PO CAPS: 100.0000 mg | ORAL_CAPSULE | Freq: Two times a day (BID) | ORAL | 0 refills | Status: AC

## 2022-11-16 MED ORDER — ONDANSETRON HCL 4 MG/2ML IJ SOLN
INTRAMUSCULAR | Status: DC | PRN
  Administered 2022-11-16: 4 mg via INTRAVENOUS

## 2022-11-16 MED ORDER — ACETAMINOPHEN 500 MG PO TABS: 1000.0000 mg | ORAL_TABLET | Freq: Three times a day (TID) | ORAL | Status: DC

## 2022-11-16 MED ORDER — PHENYLEPHRINE 80 MCG/ML (10ML) SYRINGE FOR IV PUSH (FOR BLOOD PRESSURE SUPPORT)
PREFILLED_SYRINGE | INTRAVENOUS | Status: DC | PRN
  Administered 2022-11-16: 80 ug via INTRAVENOUS
  Administered 2022-11-16: 160 ug via INTRAVENOUS

## 2022-11-16 MED ORDER — AMISULPRIDE (ANTIEMETIC) 5 MG/2ML IV SOLN: 10.0000 mg | Freq: Once | INTRAVENOUS | Status: DC | PRN

## 2022-11-16 MED ORDER — ENOXAPARIN SODIUM 40 MG/0.4ML IJ SOSY
40.0000 mg | PREFILLED_SYRINGE | INTRAMUSCULAR | Status: DC
  Administered 2022-11-17 – 2022-11-18 (×2): 40 mg via SUBCUTANEOUS
  Filled 2022-11-16 (×2): qty 0.4

## 2022-11-16 MED ORDER — PROPOFOL 10 MG/ML IV BOLUS
INTRAVENOUS | Status: DC | PRN
  Administered 2022-11-16: 150 mg via INTRAVENOUS

## 2022-11-16 MED ORDER — OXYCODONE HCL 5 MG PO TABS: 5.0000 mg | ORAL_TABLET | Freq: Four times a day (QID) | ORAL | 0 refills | Status: AC | PRN

## 2022-11-16 MED ORDER — SCOPOLAMINE 1 MG/3DAYS TD PT72
1.0000 | MEDICATED_PATCH | Freq: Once | TRANSDERMAL | Status: DC
  Administered 2022-11-16: 1.5 mg via TRANSDERMAL
  Filled 2022-11-16: qty 1

## 2022-11-16 MED ORDER — LACTATED RINGERS IV SOLN: INTRAVENOUS | Status: DC

## 2022-11-16 MED ORDER — CHLORHEXIDINE GLUCONATE 0.12 % MT SOLN
15.0000 mL | Freq: Once | OROMUCOSAL | Status: AC
  Administered 2022-11-16: 15 mL via OROMUCOSAL
  Filled 2022-11-16: qty 15

## 2022-11-16 MED ORDER — METHOCARBAMOL 500 MG PO TABS
500.0000 mg | ORAL_TABLET | Freq: Four times a day (QID) | ORAL | Status: DC
  Administered 2022-11-16 – 2022-11-18 (×8): 500 mg via ORAL
  Filled 2022-11-16 (×8): qty 1

## 2022-11-16 MED ORDER — ONDANSETRON 4 MG PO TBDP: 4.0000 mg | ORAL_TABLET | Freq: Four times a day (QID) | ORAL | Status: DC | PRN

## 2022-11-16 MED ORDER — ROCURONIUM BROMIDE 10 MG/ML (PF) SYRINGE
PREFILLED_SYRINGE | INTRAVENOUS | Status: DC | PRN
  Administered 2022-11-16: 60 mg via INTRAVENOUS

## 2022-11-16 MED ORDER — ROCURONIUM BROMIDE 10 MG/ML (PF) SYRINGE
PREFILLED_SYRINGE | INTRAVENOUS | Status: AC
  Filled 2022-11-16: qty 10

## 2022-11-16 MED ORDER — METHOCARBAMOL 500 MG PO TABS: 500.0000 mg | ORAL_TABLET | Freq: Four times a day (QID) | ORAL | 0 refills | Status: AC | PRN

## 2022-11-16 MED ORDER — ORAL CARE MOUTH RINSE: 15.0000 mL | Freq: Once | OROMUCOSAL | Status: AC

## 2022-11-16 MED ORDER — CEFAZOLIN SODIUM-DEXTROSE 2-4 GM/100ML-% IV SOLN
2.0000 g | INTRAVENOUS | Status: AC
  Administered 2022-11-16: 2 g via INTRAVENOUS
  Filled 2022-11-16: qty 100

## 2022-11-16 MED ORDER — BUPIVACAINE-EPINEPHRINE 0.25% -1:200000 IJ SOLN
INTRAMUSCULAR | Status: DC | PRN
  Administered 2022-11-16: 30 mL

## 2022-11-16 MED ORDER — DEXAMETHASONE SODIUM PHOSPHATE 10 MG/ML IJ SOLN
INTRAMUSCULAR | Status: DC | PRN
  Administered 2022-11-16: 10 mg via INTRAVENOUS

## 2022-11-16 MED ORDER — HYDROMORPHONE HCL 1 MG/ML IJ SOLN
0.2500 mg | INTRAMUSCULAR | Status: DC | PRN
  Administered 2022-11-16 (×5): 0.5 mg via INTRAVENOUS

## 2022-11-16 MED ORDER — OXYCODONE HCL 5 MG PO TABS
5.0000 mg | ORAL_TABLET | ORAL | Status: DC | PRN
  Administered 2022-11-16 (×3): 10 mg via ORAL
  Administered 2022-11-17: 5 mg via ORAL
  Administered 2022-11-17 – 2022-11-18 (×4): 10 mg via ORAL
  Filled 2022-11-16: qty 2
  Filled 2022-11-16: qty 1
  Filled 2022-11-16 (×6): qty 2

## 2022-11-16 MED ORDER — PHENYLEPHRINE 80 MCG/ML (10ML) SYRINGE FOR IV PUSH (FOR BLOOD PRESSURE SUPPORT)
PREFILLED_SYRINGE | INTRAVENOUS | Status: AC
  Filled 2022-11-16: qty 10

## 2022-11-16 MED ORDER — HYDROMORPHONE HCL 1 MG/ML IJ SOLN: 0.5000 mg | INTRAMUSCULAR | Status: DC | PRN

## 2022-11-16 MED ORDER — METRONIDAZOLE 500 MG/100ML IV SOLN
500.0000 mg | INTRAVENOUS | Status: AC
  Administered 2022-11-16: 500 mg via INTRAVENOUS
  Filled 2022-11-16: qty 100

## 2022-11-16 MED ORDER — MIDAZOLAM HCL 2 MG/2ML IJ SOLN
INTRAMUSCULAR | Status: AC
  Filled 2022-11-16: qty 2

## 2022-11-16 MED ORDER — HYDROMORPHONE HCL 1 MG/ML IJ SOLN: 0.5000 mg | Freq: Once | INTRAMUSCULAR | Status: DC

## 2022-11-16 MED ORDER — ONDANSETRON HCL 4 MG/2ML IJ SOLN
4.0000 mg | Freq: Four times a day (QID) | INTRAMUSCULAR | Status: DC | PRN
  Administered 2022-11-16 – 2022-11-17 (×4): 4 mg via INTRAVENOUS
  Filled 2022-11-16 (×3): qty 2

## 2022-11-16 MED ORDER — DOCUSATE SODIUM 100 MG PO CAPS
100.0000 mg | ORAL_CAPSULE | Freq: Two times a day (BID) | ORAL | Status: DC
  Administered 2022-11-16 – 2022-11-18 (×4): 100 mg via ORAL
  Filled 2022-11-16 (×4): qty 1

## 2022-11-16 MED ORDER — LIDOCAINE 2% (20 MG/ML) 5 ML SYRINGE
INTRAMUSCULAR | Status: DC | PRN
  Administered 2022-11-16: 100 mg via INTRAVENOUS

## 2022-11-16 SURGICAL SUPPLY — 48 items
APPLIER CLIP 5 13 M/L LIGAMAX5 (MISCELLANEOUS) ×1
BAG COUNTER SPONGE SURGICOUNT (BAG) ×1 IMPLANT
BLADE CLIPPER SURG (BLADE) IMPLANT
BLADE SURG 10 STRL SS (BLADE) ×1 IMPLANT
CANISTER SUCT 3000ML PPV (MISCELLANEOUS) ×1 IMPLANT
CHLORAPREP W/TINT 26 (MISCELLANEOUS) ×1 IMPLANT
CLIP APPLIE 5 13 M/L LIGAMAX5 (MISCELLANEOUS) IMPLANT
COVER SURGICAL LIGHT HANDLE (MISCELLANEOUS) ×2 IMPLANT
DERMABOND ADVANCED .7 DNX12 (GAUZE/BANDAGES/DRESSINGS) ×1 IMPLANT
DRAPE WARM FLUID 44X44 (DRAPES) ×1 IMPLANT
ELECT REM PT RETURN 9FT ADLT (ELECTROSURGICAL) ×1
ELECTRODE REM PT RTRN 9FT ADLT (ELECTROSURGICAL) ×1 IMPLANT
GLOVE BIO SURGEON STRL SZ 6 (GLOVE) ×1 IMPLANT
GLOVE INDICATOR 6.5 STRL GRN (GLOVE) ×1 IMPLANT
GLOVE SURG UNDER POLY LF SZ6 (GLOVE) ×1 IMPLANT
GOWN STRL REUS W/ TWL LRG LVL3 (GOWN DISPOSABLE) ×3 IMPLANT
GOWN STRL REUS W/TWL LRG LVL3 (GOWN DISPOSABLE) ×3
GRASPER SUT TROCAR 14GX15 (MISCELLANEOUS) ×1 IMPLANT
IRRIG SUCT STRYKERFLOW 2 WTIP (MISCELLANEOUS) ×1
IRRIGATION SUCT STRKRFLW 2 WTP (MISCELLANEOUS) ×1 IMPLANT
KIT BASIN OR (CUSTOM PROCEDURE TRAY) ×1 IMPLANT
L-HOOK LAP DISP 36CM (ELECTROSURGICAL)
LHOOK LAP DISP 36CM (ELECTROSURGICAL) IMPLANT
NDL INSUFFLATION 14GA 120MM (NEEDLE) ×1 IMPLANT
NEEDLE INSUFFLATION 14GA 120MM (NEEDLE) ×1 IMPLANT
NS IRRIG 1000ML POUR BTL (IV SOLUTION) ×2 IMPLANT
PAD ARMBOARD 7.5X6 YLW CONV (MISCELLANEOUS) ×2 IMPLANT
PENCIL BUTTON HOLSTER BLD 10FT (ELECTRODE) ×1 IMPLANT
RELOAD STAPLE 60 3.6 BLU REG (STAPLE) IMPLANT
RELOAD STAPLER BLUE 60MM (STAPLE) ×2 IMPLANT
SCISSORS LAP 5X35 DISP (ENDOMECHANICALS) ×1 IMPLANT
SET TUBE SMOKE EVAC HIGH FLOW (TUBING) ×1 IMPLANT
SHEARS HARMONIC ACE PLUS 36CM (ENDOMECHANICALS) ×1 IMPLANT
SLEEVE Z-THREAD 5X100MM (TROCAR) ×3 IMPLANT
STAPLE ECHEON FLEX 60 POW ENDO (STAPLE) IMPLANT
STAPLER RELOAD BLUE 60MM (STAPLE) ×2
SUT MNCRL AB 4-0 PS2 18 (SUTURE) ×1 IMPLANT
SYR BULB IRRIG 60ML STRL (SYRINGE) ×1 IMPLANT
SYS BAG RETRIEVAL 10MM (BASKET) ×1
SYSTEM BAG RETRIEVAL 10MM (BASKET) ×1 IMPLANT
TOWEL GREEN STERILE (TOWEL DISPOSABLE) ×2 IMPLANT
TRAY FOLEY MTR SLVR 16FR STAT (SET/KITS/TRAYS/PACK) IMPLANT
TRAY LAPAROSCOPIC MC (CUSTOM PROCEDURE TRAY) ×1 IMPLANT
TROCAR 11X100 Z THREAD (TROCAR) IMPLANT
TROCAR BALLN 12MMX100 BLUNT (TROCAR) IMPLANT
TROCAR Z THREAD OPTICAL 12X100 (TROCAR) ×1 IMPLANT
TROCAR Z-THREAD OPTICAL 5X100M (TROCAR) ×1 IMPLANT
WATER STERILE IRR 1000ML POUR (IV SOLUTION) ×1 IMPLANT

## 2022-11-16 NOTE — Anesthesia Procedure Notes (Signed)
Procedure Name: Intubation Date/Time: 11/16/2022 9:07 AM  Performed by: Janene Harvey, CRNAPre-anesthesia Checklist: Patient identified, Emergency Drugs available, Suction available and Patient being monitored Patient Re-evaluated:Patient Re-evaluated prior to induction Oxygen Delivery Method: Circle system utilized Preoxygenation: Pre-oxygenation with 100% oxygen Induction Type: IV induction Ventilation: Mask ventilation without difficulty Laryngoscope Size: Mac and 4 Grade View: Grade III Tube type: Oral Tube size: 7.0 mm Number of attempts: 1 Airway Equipment and Method: Stylet and Oral airway Placement Confirmation: ETT inserted through vocal cords under direct vision, positive ETCO2 and breath sounds checked- equal and bilateral Secured at: 22 cm Tube secured with: Tape Dental Injury: Teeth and Oropharynx as per pre-operative assessment

## 2022-11-16 NOTE — Discharge Instructions (Addendum)
[facility] DISCHARGE INSTRUCTIONS  Activity No heavy lifting greater than 15 pounds for 4 weeks after surgery. Ok to shower in 24 hours, but do not bathe or submerge incisions underwater. Do not drive while taking narcotic pain medication.  Wound Care Your incisions are covered with skin glue called Dermabond. This will peel off on its own over time. You may shower and allow warm soapy water to run over your incisions. Gently pat dry. Do not submerge your incision underwater. Monitor your incision for any new redness, tenderness, or drainage.  Diet You may consume regular food, but may find it easier to eat softer foods for the first 1-2 weeks after surgery.  When to Call us: Fever greater than 100.5 New redness, drainage, or swelling at incision site Severe pain, nausea, or vomiting  Follow-up You have an appointment scheduled with Dr. Zenia Resides on December 04, 2022 at 10:20am. This will be at the [facility] Surgery office at [address], [place], [place], [place]. Please arrive at least 15 minutes prior to your scheduled appointment time.  For questions or concerns, please call the office at [phone].

## 2022-11-16 NOTE — Transfer of Care (Signed)
Immediate Anesthesia Transfer of Care Note  Patient: Valerie Webb  Procedure(s) Performed: LAPAROSCOPIC GASTRIC WEDGE RESECTION  Patient Location: PACU  Anesthesia Type:General  Level of Consciousness: drowsy and patient cooperative  Airway & Oxygen Therapy: Patient Spontanous Breathing and Patient connected to face mask oxygen  Post-op Assessment: Report given to RN and Post -op Vital signs reviewed and stable  Post vital signs: Reviewed and stable  Last Vitals:  Vitals Value Taken Time  BP 159/85 11/16/22 1007  Temp    Pulse 77 11/16/22 1010  Resp 12 11/16/22 1010  SpO2 98 % 11/16/22 1010  Vitals shown include unvalidated device data.  Last Pain:  Vitals:   11/16/22 0703  TempSrc:   PainSc: 5          Complications: No notable events documented.

## 2022-11-16 NOTE — Addendum Note (Signed)
Addendum  created 11/16/22 1351 by Brennan Bailey, MD   Clinical Note Signed

## 2022-11-16 NOTE — Anesthesia Postprocedure Evaluation (Addendum)
Anesthesia Post Note  Patient: Valerie Webb  Procedure(s) Performed: LAPAROSCOPIC GASTRIC WEDGE RESECTION     Patient location during evaluation: PACU Anesthesia Type: General Level of consciousness: awake and alert Pain management: pain level controlled Vital Signs Assessment: post-procedure vital signs reviewed and stable Respiratory status: spontaneous breathing, nonlabored ventilation and respiratory function stable Cardiovascular status: blood pressure returned to baseline Postop Assessment: no apparent nausea or vomiting Anesthetic complications: no Comments: Addendum 14:48: Called to PACU by RN to evaluate patient for "chest pain". Patient describes epigastric crampy pain and nausea that has gradually increased over the past several hours. She is currently held in PACU due to bed availability. She has no SOB, presyncope, or vital sign changes. Herl ast dose of dilaudid was given at 10:42a. Her pain is consistent with expected pain from gastric wedge resection and additional pain medications are ordered. Instructed RN and patient to call back with any further concerns or unrelieved pain. Daiva Huge, MD   No notable events documented.  Last Vitals:  Vitals:   11/16/22 1055 11/16/22 1110  BP: 125/74 120/74  Pulse: 78 76  Resp: 10 13  Temp:    SpO2: 98% 99%    Last Pain:  Vitals:   11/16/22 1110  TempSrc:   PainSc: [place]

## 2022-11-16 NOTE — Op Note (Signed)
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 EUS with FNA confirmed a GIST. Staging workup showed no evidence of metastatic disease. After a discussion of the risks and benefits of surgery, the patient agreed to proceed with resection.  Findings: Well-circumscribed approximately 3cm mass originating from the posterior fundus of the stomach. Small hiatal hernia.  Procedure details: Informed consent was obtained in the preoperative area prior to the procedure. The patient was brought to the operating room and placed on the table in the supine position. General anesthesia was induced and appropriate lines and drains were placed for intraoperative monitoring. Perioperative antibiotics were administered per SCIP guidelines. The abdomen was prepped and draped in the usual sterile fashion. A pre-procedure timeout was taken verifying patient identity, surgical site and procedure to be performed.  A small skin incision was made in the LUQ at Palmer's point, the fascia was grasped and elevated, and a Veress needle was inserted through the fascia. Intraperitoneal placement was confirmed with the saline drop test and the abdomen was insufflated. A 49m Visiport was placed. The abdomen was inspected with no evidence of visceral or vascular injury. A 1649mport was placed at the umbilicus under direct visualization, followed by a 49m49mort in the RUQ and in the left lateral costal margin. The gastrocolic omentum was then opened on the proximal  greater curve of the stomach using Harmonic shears. The short gastric vessels were taken down with Harmonic shears to mobilize the fundus of the stomach. A small hiatal hernia was noted. The fundus of the stomach was retracted medially to expose the posterior stomach. There was an exophytic mass originating from the medial posterior fundus of the stomach. It was clear this could be resected via a wedge resection without narrowing the lumen of the stomach or the GE junction. The mass was retracted laterally, and a 71m67mhelon stapler was used to perform a wedge resection of the mass and adjacent gastric tissue. Two staple loads were used. The specimen was placed in an endocatch bag and removed via the umbilical port. The specimen was examined on the back table and there was a gross margin of normal tissue between the mass and staple line. The specimen was sent for routine pathology. The gastric staple line was examined and there was bleeding along the staple line. This was controlled with clip placement. The surgical site was irrigated and no further bleeding or leakage was noted. The ports were removed and the abdomen was desufflated. The umbilical port site fascia was closed with a 0 Vicryl figure-of-eight suture. The skin at all port sites was closed with subcuticular 4-0 monocryl suture. Dermabond was applied.  The patient tolerated the procedure well with no apparent complications. All counts were correct x2 at the end of the procedure. The patient was extubated and taken to PACU in stable condition.  ShelMichaelle Birks 11/16/22 10:04 AM

## 2022-11-16 NOTE — H&P (Signed)
Valerie Webb is an 62 y.o. female.   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 She had been having intermittent abdominal pain and underwent a CT scan at [facility] in March 2023. This showed a mass posterior to the proximal stomach. She was referred to GI and ultimately had an EUS on 06/28/22, which showed imaging characteristics consistent with a smooth muscle tumor, however FNA was nondiagnostic. She had a repeat EUS with FNA on 11/30, which showed a 3cm mass, path showed spindle cells positive for CD117 and CD34, consistent with a GIST. Ki67 was low. She was referred to discuss surgery. Staging CT chest showed scattered subcentimeter lung nodules, for which surveillance was recommended.  Previous abdominal surgeries include a colon resection in 2008 for redundant colon, which she says was done in [place]. She has also had surgery for endometriosis, she thinks around 2000. She reports her sister had ovarian cancer, and her father also had cancer. She has not had any genetic screening.    Past Medical History:  Diagnosis Date   Cancer [facility])    stomach cancer   Congenital redundant colon 2008   Endometriosis 2001   Found on exploratory laparoscopic surgery.   Headache    migraines   Heart murmur 1993   Normal Echocardiogram 2021    Past Surgical History:  Procedure Laterality Date   ABLATION ON ENDOMETRIOSIS  2008   BIOPSY  06/28/2022   Procedure: BIOPSY;  Surgeon: Irving Copas., MD;  Location: [facility] ENDOSCOPY;  Service: Gastroenterology;;   BREAST EXCISIONAL BIOPSY Right    years ago benign   chin implant  2005   COLON SURGERY  2008   Removal of redundant colon found on surgery for removal of endometriosis surgery   ESOPHAGOGASTRODUODENOSCOPY (EGD) WITH PROPOFOL N/A 06/28/2022   Procedure: ESOPHAGOGASTRODUODENOSCOPY (EGD) WITH PROPOFOL;  Surgeon: Irving Copas., MD;  Location: [facility];  Service: Gastroenterology;  Laterality: N/A;   ESOPHAGOGASTRODUODENOSCOPY (EGD) WITH PROPOFOL N/A 08/23/2022   Procedure: ESOPHAGOGASTRODUODENOSCOPY (EGD) WITH PROPOFOL;  Surgeon: Rush Landmark Telford Nab., MD;  Location: [facility] ENDOSCOPY;  Service: Gastroenterology;  Laterality: N/A;   EUS N/A 06/28/2022   Procedure: UPPER ENDOSCOPIC ULTRASOUND (EUS) RADIAL;  Surgeon: Irving Copas., MD;  Location: [facility] ENDOS[facility];  Service: Gastroenterology;  Laterality: N/A;   EUS N/A 08/23/2022   Procedure: UPPER ENDOSCOPIC ULTRASOUND (EUS) RADIAL;  Surgeon: Irving Copas., MD;  Location: [facility] ENDOS[facility];  Service: Gastroenterology;  Laterality: N/A;   Exploratory Laparoscopic surgery  2001   Pelvic and abdominal pain   FINE NEEDLE ASPIRATION  06/28/2022   Procedure: FINE NEEDLE ASPIRATION (FNA) LINEAR;  Surgeon: Irving Copas., MD;  Location: [facility];  Service: Gastroenterology;;   FINE NEEDLE ASPIRATION N/A 08/23/2022   Procedure: FINE NEEDLE ASPIRATION (FNA) LINEAR;  Surgeon: Irving Copas., MD;  Location: [facility];  Service: Gastroenterology;  Laterality: N/A;    Family History  Problem Relation Age of Onset   Diabetes Mother    Alcohol abuse Father    Throat cancer Father        Throat cancer:  cause of death   Drug abuse Father    Non-Hodgkin's lymphoma Sister 82       Remission   Ovarian cancer Maternal Grandmother    Breast cancer Neg Hx    Stomach cancer Neg Hx    Esophageal cancer Neg Hx    Pancreatic cancer Neg Hx  Colon cancer Neg Hx    Social History:  reports that she has never smoked. She has never used smokeless tobacco. She reports that she does not currently use alcohol. She reports that she does not use drugs.  Allergies: No Known Allergies  Medications Prior to Admission  Medication Sig Dispense Refill   Aspirin-Acetaminophen-Caffeine (EXCEDRIN EXTRA STRENGTH PO) Take 2 tablets by mouth daily as needed (Tension headache).       Results for orders placed or performed during the hospital encounter of 11/16/22 (from the past 48 hour(s))  ABO/Rh     Status: None (Preliminary result)   Collection Time: 11/16/22  7:02 AM  Result Value Ref Range   ABO/RH(D) PENDING    No results found.  Review of Systems  Blood pressure (!) 135/94, pulse 98, temperature 98.4 F (36.9 C), temperature source Oral, resp. rate 20, height '5\' 11"'$  (1.803 m), weight 75.3 kg, SpO2 97 %. Physical Exam Vitals reviewed.  Constitutional:      General: She is not in acute distress.    Appearance: Normal appearance.  HENT:     Head: Normocephalic and atraumatic.  Eyes:     General: No scleral icterus.    Conjunctiva/sclera: Conjunctivae normal.  Pulmonary:     Effort: Pulmonary effort is normal. No respiratory distress.  Abdominal:     General: There is no distension.     Palpations: Abdomen is soft.     Tenderness: There is no abdominal tenderness.  Musculoskeletal:        General: Normal range of motion.  Skin:    General: Skin is warm and dry.  Neurological:     General: No focal deficit present.     Mental Status: She is alert and oriented to person, place, and time.      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 Proceed to the OR for laparoscopic resection. Patient is to be admitted postoperatively. All questions answered.  Dwan Bolt, MD 11/16/2022, 8:08 AM

## 2022-11-17 ENCOUNTER — Encounter (HOSPITAL_COMMUNITY): Payer: Self-pay | Admitting: Surgery

## 2022-11-17 LAB — CBC
HCT: 36.3 % (ref 36.0–46.0)
Hemoglobin: 11.8 g/dL — ABNORMAL LOW (ref 12.0–15.0)
MCH: 27.2 pg (ref 26.0–34.0)
MCHC: 32.5 g/dL (ref 30.0–36.0)
MCV: 83.6 fL (ref 80.0–100.0)
Platelets: 283 10*3/uL (ref 150–400)
RBC: 4.34 MIL/uL (ref 3.87–5.11)
RDW: 13.3 % (ref 11.5–15.5)
WBC: 7.7 10*3/uL (ref 4.0–10.5)
nRBC: 0 % (ref 0.0–0.2)

## 2022-11-17 LAB — BASIC METABOLIC PANEL
Anion gap: 11 (ref 5–15)
BUN: 9 mg/dL (ref 8–23)
CO2: 24 mmol/L (ref 22–32)
Calcium: 9.2 mg/dL (ref 8.9–10.3)
Chloride: 102 mmol/L (ref 98–111)
Creatinine, Ser: 0.77 mg/dL (ref 0.44–1.00)
GFR, Estimated: >60 mL/min (ref >60)
Glucose, Bld: 90 mg/dL (ref 70–99)
Potassium: 4.6 mmol/L (ref 3.5–5.1)
Sodium: 137 mmol/L (ref 135–145)

## 2022-11-17 NOTE — Progress Notes (Signed)
1 Day Post-Op lap gastric wedge resection Subjective: Had quite a bit of nausea and pain last night.  Still has nausea this am with movement.  Tolerating sips of clears  Objective: Vital signs in last 24 hours: Temp:  [97.4 F (36.3 C)-98 F (36.7 C)] 97.4 F (36.3 C) (02/24 0743) Pulse Rate:  [68-84] 84 (02/24 0743) Resp:  [10-19] 16 (02/24 0743) BP: (116-142)/(70-78) 142/73 (02/24 0743) SpO2:  [96 %-100 %] 99 % (02/24 0743)   Intake/Output from previous day: 02/23 0701 - 02/24 0700 In: 1020 [P.O.:120; I.V.:900] Out: 25 [Blood:25] Intake/Output this shift: Total I/O In: 220 [P.O.:220] Out: -    General appearance: alert and cooperative GI: soft, distended  Incision: no significant drainage  Lab Results:  Recent Labs    11/17/22 0248  WBC 7.7  HGB 11.8*  HCT 36.3  PLT 283   BMET Recent Labs    11/17/22 0248  NA 137  K 4.6  CL 102  CO2 24  GLUCOSE 90  BUN 9  CREATININE 0.77  CALCIUM 9.2   PT/INR No results for input(s): "LABPROT", "INR" in the last 72 hours. ABG No results for input(s): "PHART", "HCO3" in the last 72 hours.  Invalid input(s): "PCO2", "PO2"  MEDS, Scheduled  docusate sodium  100 mg Oral BID   enoxaparin (LOVENOX) injection  40 mg Subcutaneous Q24H   methocarbamol  500 mg Oral QID   scopolamine  1 patch Transdermal Once    Studies/Results: No results found.  Assessment: s/p Procedure(s): LAPAROSCOPIC GASTRIC WEDGE RESECTION Patient Active Problem List   Diagnosis Date Noted   Gastrointestinal stromal tumor (GIST) of stomach ([place]) 11/16/2022   GIST (gastrointestinal stromal tumor), malignant ([place]) 09/27/2022   Menopausal state 07/31/2020   Right lower quadrant pain 07/31/2020   Chest pain 07/31/2020   Hyperlipidemia 06/24/2020   LVH (left ventricular hypertrophy) 06/24/2020   Endometriosis    Congenital redundant colon 2008   Heart murmur 1993    Expected post op course  Plan: PAS Diet as tolerated, pt only able  to take sips of clears.   Monitor UOP    LOS: 1 day     .Rosario Adie, MD [facility], [place]    11/17/2022 10:26 AM

## 2022-11-18 NOTE — Discharge Summary (Signed)
Physician Discharge Summary  Patient ID: Valerie Webb MRN: [id number] DOB/AGE: 04-03-1961 62 y.o.  Admit date: 11/16/2022 Discharge date: 11/18/2022  Admission Diagnoses: GIST  Discharge Diagnoses:  Principal Problem:   Gastrointestinal stromal tumor (GIST) of stomach [facility])   Discharged Condition: good  Hospital Course: Patient was admitted to the med surg floor after surgery.  Diet was advanced as tolerated. By postop day 2, she was tolerating a solid diet and pain was controlled with oral medications.  She was urinating without difficulty and ambulating without assistance.  Patient was felt to be in stable condition for discharge to home.   Consults: None  Significant Diagnostic Studies: labs: cbc, bmet  Treatments: analgesia: acetaminophen and surgery: lap gastric wedge resection  Discharge Exam: Blood pressure 138/74, pulse 96, temperature 98.2 F (36.8 C), temperature source Oral, resp. rate 18, height '5\' 11"'$  (1.803 m), weight 75.3 kg, SpO2 98 %. General appearance: alert and cooperative GI: soft, non-distended Incision/Wound: clean, dry, intact  Disposition: Discharge disposition: 01-Home or Self Care        Allergies as of 11/18/2022   No Known Allergies      Medication List     TAKE these medications    docusate sodium 100 MG capsule Commonly known as: COLACE Take 1 capsule (100 mg total) by mouth 2 (two) times daily.   EXCEDRIN EXTRA STRENGTH PO Take 2 tablets by mouth daily as needed (Tension headache).   methocarbamol 500 MG tablet Commonly known as: ROBAXIN Take 1 tablet (500 mg total) by mouth every 6 (six) hours as needed for muscle spasms.   ondansetron 4 MG disintegrating tablet Commonly known as: ZOFRAN-ODT Take 1 tablet (4 mg total) by mouth every 6 (six) hours as needed for nausea.   oxyCODONE 5 MG immediate release tablet Commonly known as: Oxy IR/ROXICODONE Take 1-2 tablets (5-10 mg total) by mouth every 6 (six) hours as  needed for up to 5 days for severe pain.        Follow-up Information     Dwan Bolt, MD. Schedule an appointment as soon as possible for a visit in 2 week(s).   Specialty: General Surgery Contact information: [address] [place] [address] [phone]                 Signed: Rosario Adie [id number], 9:19 AM

## 2022-11-18 NOTE — Care Management (Signed)
11-18-22 1403 Case Manager called [facility] and the total is $122.00. Patient is without insurance. Case Manager completed the [facility] and faxed the information to [place]. No further needs identified at this time.

## 2022-11-20 LAB — SURGICAL PATHOLOGY

## 2022-11-21 ENCOUNTER — Other Ambulatory Visit: Payer: Self-pay

## 2022-11-21 NOTE — Progress Notes (Signed)
The proposed treatment discussed in conference is for discussion purpose only and is not a binding recommendation.  The patients have not been physically examined, or presented with their treatment options.  Therefore, final treatment plans cannot be decided.  

## 2022-12-04 ENCOUNTER — Inpatient Hospital Stay: Payer: Medicaid Other | Attending: Physician Assistant | Admitting: Genetic Counselor

## 2022-12-04 ENCOUNTER — Inpatient Hospital Stay: Payer: Medicaid Other
